# Patient Record
Sex: Female | Born: 1980 | Race: White | Hispanic: No | Smoking: Current every day smoker
Health system: Southern US, Community
[De-identification: ages and names within clinical notes are randomized; demographics above are authoritative.]

## PROBLEM LIST (undated history)

## (undated) DIAGNOSIS — R87629 Unspecified abnormal cytological findings in specimens from vagina: Secondary | ICD-10-CM

## (undated) DIAGNOSIS — R87619 Unspecified abnormal cytological findings in specimens from cervix uteri: Secondary | ICD-10-CM

## (undated) HISTORY — PX: CHOLECYSTECTOMY: SHX55

## (undated) HISTORY — DX: Unspecified abnormal cytological findings in specimens from cervix uteri: R87.619

## (undated) HISTORY — DX: Unspecified abnormal cytological findings in specimens from vagina: R87.629

## (undated) HISTORY — PX: WISDOM TOOTH EXTRACTION: SHX21

## (undated) HISTORY — DX: Morbid (severe) obesity due to excess calories: E66.01

## (undated) HISTORY — PX: TONSILLECTOMY: SUR1361

---

## 2008-05-22 ENCOUNTER — Emergency Department (HOSPITAL_COMMUNITY): Admission: EM | Admit: 2008-05-22 | Discharge: 2008-05-22 | Payer: Self-pay | Admitting: *Deleted

## 2008-11-06 ENCOUNTER — Emergency Department (HOSPITAL_COMMUNITY): Admission: EM | Admit: 2008-11-06 | Discharge: 2008-11-06 | Payer: Self-pay | Admitting: Emergency Medicine

## 2009-10-29 ENCOUNTER — Emergency Department (HOSPITAL_COMMUNITY): Admission: EM | Admit: 2009-10-29 | Discharge: 2009-10-29 | Payer: Self-pay | Admitting: Emergency Medicine

## 2010-03-22 LAB — URINALYSIS, ROUTINE W REFLEX MICROSCOPIC
Glucose, UA: NEGATIVE mg/dL
Ketones, ur: NEGATIVE mg/dL
Protein, ur: NEGATIVE mg/dL
Urobilinogen, UA: 0.2 mg/dL (ref 0.0–1.0)

## 2010-03-22 LAB — URINE MICROSCOPIC-ADD ON

## 2010-04-13 LAB — URINALYSIS, ROUTINE W REFLEX MICROSCOPIC
Nitrite: NEGATIVE
Protein, ur: NEGATIVE mg/dL
Specific Gravity, Urine: 1.015 (ref 1.005–1.030)
Urobilinogen, UA: 1 mg/dL (ref 0.0–1.0)

## 2010-04-13 LAB — URINE MICROSCOPIC-ADD ON

## 2010-04-13 LAB — URINE CULTURE

## 2010-12-08 ENCOUNTER — Emergency Department (HOSPITAL_COMMUNITY)
Admission: EM | Admit: 2010-12-08 | Discharge: 2010-12-08 | Disposition: A | Discharge status: Home / Self Care | Payer: 59 | Attending: Emergency Medicine | Admitting: Emergency Medicine

## 2010-12-08 ENCOUNTER — Encounter: Payer: Self-pay | Admitting: *Deleted

## 2010-12-08 DIAGNOSIS — R112 Nausea with vomiting, unspecified: Secondary | ICD-10-CM | POA: Insufficient documentation

## 2010-12-08 DIAGNOSIS — R109 Unspecified abdominal pain: Secondary | ICD-10-CM | POA: Insufficient documentation

## 2010-12-08 LAB — POCT I-STAT, CHEM 8
BUN: 15 mg/dL (ref 6–23)
Hemoglobin: 15 g/dL (ref 12.0–15.0)
Potassium: 3.5 mEq/L (ref 3.5–5.1)
Sodium: 143 mEq/L (ref 135–145)
TCO2: 24 mmol/L (ref 0–100)

## 2010-12-08 LAB — CBC
MCH: 32.5 pg (ref 26.0–34.0)
Platelets: 257 10*3/uL (ref 150–400)
RBC: 4.4 MIL/uL (ref 3.87–5.11)
WBC: 12.7 10*3/uL — ABNORMAL HIGH (ref 4.0–10.5)

## 2010-12-08 LAB — DIFFERENTIAL
Eosinophils Absolute: 0.4 10*3/uL (ref 0.0–0.7)
Lymphs Abs: 4.3 10*3/uL — ABNORMAL HIGH (ref 0.7–4.0)
Neutro Abs: 6.9 10*3/uL (ref 1.7–7.7)
Neutrophils Relative %: 54 % (ref 43–77)

## 2010-12-08 LAB — URINALYSIS, DIPSTICK ONLY
Bilirubin Urine: NEGATIVE
Nitrite: NEGATIVE
Specific Gravity, Urine: 1.026 (ref 1.005–1.030)
pH: 6.5 (ref 5.0–8.0)

## 2010-12-08 LAB — POCT PREGNANCY, URINE: Preg Test, Ur: NEGATIVE

## 2010-12-08 MED ORDER — TRAMADOL HCL 50 MG PO TABS: 50.0000 mg | ORAL_TABLET | Freq: Four times a day (QID) | ORAL | Status: AC | PRN

## 2010-12-08 MED ORDER — ONDANSETRON HCL 4 MG PO TABS: 4.0000 mg | ORAL_TABLET | Freq: Four times a day (QID) | ORAL | Status: AC

## 2010-12-08 MED ORDER — SODIUM CHLORIDE 0.9 % IV BOLUS (SEPSIS)
1000.0000 mL | Freq: Once | INTRAVENOUS | Status: AC
  Administered 2010-12-08: 1000 mL via INTRAVENOUS

## 2010-12-08 MED ORDER — ONDANSETRON HCL 4 MG/2ML IJ SOLN
4.0000 mg | Freq: Once | INTRAMUSCULAR | Status: AC
  Administered 2010-12-08: 4 mg via INTRAVENOUS
  Filled 2010-12-08: qty 2

## 2010-12-08 NOTE — ED Notes (Signed)
PA in to see pt. 

## 2010-12-08 NOTE — ED Notes (Signed)
Pt states that she has had stomach cramps for the past week.  Around 0130 this am, the cramps became very painful and she states "I feel like I'm gonna have a baby".  Pt's abdomin is soft and tender to palpation only mid abdominally to her epigastric region. Pt has attempted to vomit but is unable.  Pt denies diarrhea.

## 2010-12-08 NOTE — ED Provider Notes (Signed)
History     CSN: [id number] Arrival date & time: 12/08/2010  4:50 AM   First MD Initiated Contact with Patient 12/08/10 [phone]      Chief Complaint  Patient presents with  . Abdominal Pain    (Consider location/radiation/quality/duration/timing/severity/associated sxs/prior treatment) Patient is a 30 y.o. female presenting with abdominal pain. The history is provided by the patient.  Abdominal Pain The primary symptoms of the illness include abdominal pain, nausea and vomiting (1 episode). The primary symptoms of the illness do not include fever, fatigue, shortness of breath, diarrhea, hematemesis, hematochezia, dysuria, vaginal discharge or vaginal bleeding. The current episode started more than 2 days ago. The onset of the illness was gradual. The problem has been gradually improving.  The pain came on suddenly. The abdominal pain is located in the LUQ and epigastric region. The abdominal pain does not radiate. The severity of the abdominal pain is 5/10. The abdominal pain is relieved by nothing.  The patient states that she believes she is currently not pregnant. The patient has not had a change in bowel habit.   Pt states that she has not been feeling well for the past week, then last night when she woke up she was having very sharp LUQ pain and sensations of cramping to where she thought she was going to "have a baby" The pain was at a 10 but upon my examination the pain is now a 2/10. She has had one episode of vomiting, no diarrhea, no fevers.  History reviewed. No pertinent past medical history.  Past Surgical History  Procedure Date  . Tonsillectomy     History reviewed. No pertinent family history.  History  Substance Use Topics  . Smoking status: Not on file  . Smokeless tobacco: Not on file  . Alcohol Use:     OB History    Grav Para Term Preterm Abortions TAB SAB Ect Mult Living   4 4 4  0 0 0 0 0 0 4      Review of Systems  Constitutional: Negative for fever  and fatigue.  Respiratory: Negative for shortness of breath.   Gastrointestinal: Positive for nausea, vomiting (1 episode) and abdominal pain. Negative for diarrhea, hematochezia and hematemesis.  Genitourinary: Negative for dysuria, vaginal bleeding and vaginal discharge.  All other systems reviewed and are negative.    Allergies  Review of patient's allergies indicates no known allergies.  Home Medications   Current Outpatient Rx  Name Route Sig Dispense Refill  . ASPIRIN-ACETAMINOPHEN-CAFFEINE 250-250-65 MG PO TABS Oral Take 1 tablet by mouth every 6 (six) hours as needed. Headaches       BP 137/82  Pulse 80  Temp(Src) 98 F (36.7 C) (Oral)  Resp 19  SpO2 100%  LMP 11/24/2010  Physical Exam  Nursing note and vitals reviewed. Constitutional: She appears well-developed and well-nourished.  HENT:  Head: Normocephalic and atraumatic.  Eyes: Conjunctivae are normal. Pupils are equal, round, and reactive to light.  Neck: Trachea normal, normal range of motion and full passive range of motion without pain. Neck supple.  Cardiovascular: Normal rate, regular rhythm and normal pulses.   Pulmonary/Chest: Effort normal and breath sounds normal. Chest wall is not dull to percussion. She exhibits no tenderness, no crepitus, no edema, no deformity and no retraction.  Abdominal: Soft. Normal appearance and bowel sounds are normal. She exhibits no distension and no mass. There is no tenderness. There is no rebound and no guarding.  Musculoskeletal: Normal range of motion.  Neurological:  She is alert. She has normal strength.  Skin: Skin is warm, dry and intact.  Psychiatric: She has a normal mood and affect. Her speech is normal and behavior is normal. Judgment and thought content normal. Cognition and memory are normal.    ED Course  Procedures (including critical care time)  Labs Reviewed  CBC - Abnormal; Notable for the following:    WBC 12.7 (*)    All other components within  normal limits  DIFFERENTIAL - Abnormal; Notable for the following:    Lymphs Abs 4.3 (*)    Monocytes Absolute 1.1 (*)    All other components within normal limits  POCT I-STAT, CHEM 8 - Abnormal; Notable for the following:    Glucose, Bld 112 (*)    All other components within normal limits  URINALYSIS, DIPSTICK ONLY - Abnormal; Notable for the following:    Leukocytes, UA SMALL (*)    All other components within normal limits  POCT PREGNANCY, URINE  PREGNANCY, URINE  I-STAT, CHEM 8  POCT PREGNANCY, URINE  LIPASE, BLOOD   No results found.   No diagnosis found.    MDM  Pt feeling better with fluids and antinausea medication. Still having some mild LUQ cramping but its down to a 1-2/10.        Dorthula Matas, PA 12/08/10 [phone]

## 2010-12-08 NOTE — ED Provider Notes (Signed)
Medical screening examination/treatment/procedure(s) were performed by non-physician practitioner and as supervising physician I was immediately available for consultation/collaboration.  Olivia Mackie, MD 12/08/10 [phone]

## 2011-01-31 ENCOUNTER — Emergency Department (HOSPITAL_COMMUNITY)
Admission: EM | Admit: 2011-01-31 | Discharge: 2011-01-31 | Disposition: A | Discharge status: Home / Self Care | Payer: 59 | Attending: Emergency Medicine | Admitting: Emergency Medicine

## 2011-01-31 ENCOUNTER — Emergency Department (HOSPITAL_COMMUNITY): Payer: 59

## 2011-01-31 ENCOUNTER — Encounter (HOSPITAL_COMMUNITY): Payer: Self-pay | Admitting: General Practice

## 2011-01-31 DIAGNOSIS — K2970 Gastritis, unspecified, without bleeding: Secondary | ICD-10-CM | POA: Insufficient documentation

## 2011-01-31 DIAGNOSIS — K2990 Gastroduodenitis, unspecified, without bleeding: Secondary | ICD-10-CM | POA: Insufficient documentation

## 2011-01-31 DIAGNOSIS — R109 Unspecified abdominal pain: Secondary | ICD-10-CM | POA: Insufficient documentation

## 2011-01-31 DIAGNOSIS — R10819 Abdominal tenderness, unspecified site: Secondary | ICD-10-CM | POA: Insufficient documentation

## 2011-01-31 LAB — URINALYSIS, ROUTINE W REFLEX MICROSCOPIC
Bilirubin Urine: NEGATIVE
Hgb urine dipstick: NEGATIVE
Ketones, ur: NEGATIVE mg/dL
Nitrite: NEGATIVE
Protein, ur: NEGATIVE mg/dL
Specific Gravity, Urine: 1.028 (ref 1.005–1.030)
Urobilinogen, UA: 0.2 mg/dL (ref 0.0–1.0)

## 2011-01-31 LAB — DIFFERENTIAL
Lymphocytes Relative: 25 % (ref 12–46)
Lymphs Abs: 2.3 10*3/uL (ref 0.7–4.0)
Neutrophils Relative %: 63 % (ref 43–77)

## 2011-01-31 LAB — COMPREHENSIVE METABOLIC PANEL
ALT: 30 U/L (ref 0–35)
Alkaline Phosphatase: 117 U/L (ref 39–117)
BUN: 12 mg/dL (ref 6–23)
CO2: 25 mEq/L (ref 19–32)
GFR calc Af Amer: >90 mL/min (ref >90)
GFR calc non Af Amer: >90 mL/min (ref >90)
Glucose, Bld: 78 mg/dL (ref 70–99)
Potassium: 3.5 mEq/L (ref 3.5–5.1)
Total Protein: 6.9 g/dL (ref 6.0–8.3)

## 2011-01-31 LAB — CBC
Platelets: 255 10*3/uL (ref 150–400)
RBC: 4.4 MIL/uL (ref 3.87–5.11)
WBC: 9.1 10*3/uL (ref 4.0–10.5)

## 2011-01-31 LAB — LIPASE, BLOOD: Lipase: 26 U/L (ref 11–59)

## 2011-01-31 MED ORDER — LANSOPRAZOLE 30 MG PO CPDR: 30.0000 mg | DELAYED_RELEASE_CAPSULE | Freq: Every day | ORAL | Status: DC

## 2011-01-31 MED ORDER — GI COCKTAIL ~~LOC~~
30.0000 mL | Freq: Once | ORAL | Status: AC
  Administered 2011-01-31: 30 mL via ORAL
  Filled 2011-01-31: qty 30

## 2011-01-31 NOTE — ED Notes (Signed)
Pt remains in U/s

## 2011-01-31 NOTE — ED Notes (Signed)
Patient c/o intermittent abdominal pain for about three months. Patient reports increased abdominal pain over the past three days with nausea and vomiting.

## 2011-01-31 NOTE — ED Notes (Signed)
Patient transported to Ultrasound 

## 2011-01-31 NOTE — ED Provider Notes (Signed)
History     CSN: [id number]  Arrival date & time 01/31/11  1030   First MD Initiated Contact with Patient 01/31/11 1137      Chief Complaint  Patient presents with  . Abdominal Pain  . Nausea  . Emesis    (Consider location/radiation/quality/duration/timing/severity/associated sxs/prior treatment) HPI Comments: PT with 3 month hx of intermittent pains to upper abd.  Worse last 3 days.  Not related to eating.  +n/v last couple of days.  No fevers, no UTI symptoms.    Patient is a 31 y.o. female presenting with abdominal pain. The history is provided by the patient.  Abdominal Pain The primary symptoms of the illness include abdominal pain, nausea and vomiting. The primary symptoms of the illness do not include fever, fatigue, shortness of breath or diarrhea. The onset of the illness was gradual. The problem has been gradually worsening.  Symptoms associated with the illness do not include chills, anorexia, diaphoresis, heartburn, constipation, urgency, hematuria, frequency or back pain. Significant associated medical issues do not include PUD, inflammatory bowel disease, diabetes or gallstones.    History reviewed. No pertinent past medical history.  Past Surgical History  Procedure Date  . Tonsillectomy     No family history on file.  History  Substance Use Topics  . Smoking status: Current Everyday Smoker -- 0.5 packs/day for 5 years  . Smokeless tobacco: Not on file  . Alcohol Use: No    OB History    Grav Para Term Preterm Abortions TAB SAB Ect Mult Living   4 4 4  0 0 0 0 0 0 4      Review of Systems  Constitutional: Negative for fever, chills, diaphoresis and fatigue.  HENT: Negative for congestion, rhinorrhea and sneezing.   Eyes: Negative.   Respiratory: Negative for cough, chest tightness and shortness of breath.   Cardiovascular: Negative for chest pain and leg swelling.  Gastrointestinal: Positive for nausea, vomiting and abdominal pain. Negative for  heartburn, diarrhea, constipation, blood in stool and anorexia.  Genitourinary: Negative for urgency, frequency, hematuria, flank pain and difficulty urinating.  Musculoskeletal: Negative for back pain and arthralgias.  Skin: Negative for rash.  Neurological: Negative for dizziness, speech difficulty, weakness, numbness and headaches.    Allergies  Review of patient's allergies indicates no known allergies.  Home Medications   Current Outpatient Rx  Name Route Sig Dispense Refill  . ETONOGESTREL-ETHINYL ESTRADIOL 0.12-0.015 MG/24HR VA RING Vaginal Place 1 each vaginally every 28 (twenty-eight) days. Insert vaginally and leave in place for 3 consecutive weeks, then remove for 1 week.    Marland Kitchen LANSOPRAZOLE 30 MG PO CPDR Oral Take 30 mg by mouth daily.    Marland Kitchen ONDANSETRON HCL 4 MG PO TABS Oral Take 4 mg by mouth every 6 (six) hours as needed. nausea    . TRAMADOL HCL 50 MG PO TABS Oral Take 50 mg by mouth every 6 (six) hours as needed. pain    . LANSOPRAZOLE 30 MG PO CPDR Oral Take 1 capsule (30 mg total) by mouth daily. 30 capsule 0    BP 157/96  Pulse 80  Temp(Src) 98 F (36.7 C) (Oral)  Resp 18  SpO2 100%  LMP 12/31/2010  Physical Exam  Constitutional: She is oriented to person, place, and time. She appears well-developed and well-nourished.  HENT:  Head: Normocephalic and atraumatic.  Eyes: Pupils are equal, round, and reactive to light.  Neck: Normal range of motion. Neck supple.  Cardiovascular: Normal rate, regular rhythm and  normal heart sounds.   Pulmonary/Chest: Effort normal and breath sounds normal. No respiratory distress. She has no wheezes. She has no rales. She exhibits no tenderness.  Abdominal: Soft. Bowel sounds are normal. There is tenderness. There is no rebound and no guarding.       Mild tenderness to upper abd  Musculoskeletal: Normal range of motion. She exhibits no edema.  Lymphadenopathy:    She has no cervical adenopathy.  Neurological: She is alert and  oriented to person, place, and time.  Skin: Skin is warm and dry. No rash noted.  Psychiatric: She has a normal mood and affect.    ED Course  Procedures (including critical care time)  Results for orders placed during the hospital encounter of 01/31/11  CBC      Component Value Range   WBC 9.1  4.0 - 10.5 (K/uL)   RBC 4.40  3.87 - 5.11 (MIL/uL)   Hemoglobin 14.2  12.0 - 15.0 (g/dL)   HCT 16.1  09.6 - 04.5 (%)   MCV 92.7  78.0 - 100.0 (fL)   MCH 32.3  26.0 - 34.0 (pg)   MCHC 34.8  30.0 - 36.0 (g/dL)   RDW 40.9  81.1 - 91.4 (%)   Platelets 255  150 - 400 (K/uL)  DIFFERENTIAL      Component Value Range   Neutrophils Relative 63  43 - 77 (%)   Neutro Abs 5.7  1.7 - 7.7 (K/uL)   Lymphocytes Relative 25  12 - 46 (%)   Lymphs Abs 2.3  0.7 - 4.0 (K/uL)   Monocytes Relative 8  3 - 12 (%)   Monocytes Absolute 0.7  0.1 - 1.0 (K/uL)   Eosinophils Relative 3  0 - 5 (%)   Eosinophils Absolute 0.3  0.0 - 0.7 (K/uL)   Basophils Relative 1  0 - 1 (%)   Basophils Absolute 0.1  0.0 - 0.1 (K/uL)  COMPREHENSIVE METABOLIC PANEL      Component Value Range   Sodium 140  135 - 145 (mEq/L)   Potassium 3.5  3.5 - 5.1 (mEq/L)   Chloride 106  96 - 112 (mEq/L)   CO2 25  19 - 32 (mEq/L)   Glucose, Bld 78  70 - 99 (mg/dL)   BUN 12  6 - 23 (mg/dL)   Creatinine, Ser 7.82  0.50 - 1.10 (mg/dL)   Calcium 9.0  8.4 - 95.6 (mg/dL)   Total Protein 6.9  6.0 - 8.3 (g/dL)   Albumin 3.8  3.5 - 5.2 (g/dL)   AST 19  0 - 37 (U/L)   ALT 30  0 - 35 (U/L)   Alkaline Phosphatase 117  39 - 117 (U/L)   Total Bilirubin 0.1 (*) 0.3 - 1.2 (mg/dL)   GFR calc non Af Amer >90  >90 (mL/min)   GFR calc Af Amer >90  >90 (mL/min)  LIPASE, BLOOD      Component Value Range   Lipase 26  11 - 59 (U/L)  URINALYSIS, ROUTINE W REFLEX MICROSCOPIC      Component Value Range   Color, Urine YELLOW  YELLOW    APPearance CLEAR  CLEAR    Specific Gravity, Urine 1.028  1.005 - 1.030    pH 5.5  5.0 - 8.0    Glucose, UA NEGATIVE   NEGATIVE (mg/dL)   Hgb urine dipstick NEGATIVE  NEGATIVE    Bilirubin Urine NEGATIVE  NEGATIVE    Ketones, ur NEGATIVE  NEGATIVE (mg/dL)   Protein,  ur NEGATIVE  NEGATIVE (mg/dL)   Urobilinogen, UA 0.2  0.0 - 1.0 (mg/dL)   Nitrite NEGATIVE  NEGATIVE    Leukocytes, UA NEGATIVE  NEGATIVE   PREGNANCY, URINE      Component Value Range   Preg Test, Ur NEGATIVE     US Abdomen Complete  01/31/2011  *RADIOLOGY REPORT*  Clinical Data:  Abdominal pain.  COMPLETE ABDOMINAL ULTRASOUND  Comparison:  None.  Findings:  Gallbladder:  No gallstones, gallbladder wall thickening, or pericholecystic fluid.  Common bile duct:   Within normal limits in caliber.  Liver:  No focal lesion identified.  Within normal limits in parenchymal echogenicity.  IVC:  Appears normal.  Pancreas:  No focal abnormality seen.  Spleen:  Within normal limits in size and echotexture.  Right Kidney:   Normal in size and parenchymal echogenicity.  No evidence of mass or hydronephrosis.  Left Kidney:  Normal in size and parenchymal echogenicity.  No evidence of mass or hydronephrosis.  Abdominal aorta:  No aneurysm identified.  IMPRESSION: Negative abdominal ultrasound.  Original Report Authenticated By: Cyndie Chime, M.D.      1. Gastritis       MDM  No evidence of gallstones.  Better after GI cocktail.  No evidence of pancreatitis. Likely gastritis        Rolan Bucco, MD 01/31/11 [phone]

## 2011-02-07 ENCOUNTER — Other Ambulatory Visit: Payer: Self-pay | Admitting: Gastroenterology

## 2011-02-07 ENCOUNTER — Ambulatory Visit
Admission: RE | Admit: 2011-02-07 | Discharge: 2011-02-07 | Disposition: A | Discharge status: Home / Self Care | Payer: 59 | Source: Ambulatory Visit | Attending: Gastroenterology | Admitting: Gastroenterology

## 2011-02-13 ENCOUNTER — Encounter (HOSPITAL_COMMUNITY): Payer: Self-pay | Admitting: Emergency Medicine

## 2011-02-13 ENCOUNTER — Emergency Department (HOSPITAL_COMMUNITY)
Admission: EM | Admit: 2011-02-13 | Discharge: 2011-02-14 | Disposition: A | Discharge status: Home / Self Care | Payer: 59 | Attending: Emergency Medicine | Admitting: Emergency Medicine

## 2011-02-13 DIAGNOSIS — F17200 Nicotine dependence, unspecified, uncomplicated: Secondary | ICD-10-CM | POA: Insufficient documentation

## 2011-02-13 DIAGNOSIS — R112 Nausea with vomiting, unspecified: Secondary | ICD-10-CM | POA: Insufficient documentation

## 2011-02-13 DIAGNOSIS — K5900 Constipation, unspecified: Secondary | ICD-10-CM | POA: Insufficient documentation

## 2011-02-13 DIAGNOSIS — R1084 Generalized abdominal pain: Secondary | ICD-10-CM | POA: Insufficient documentation

## 2011-02-13 NOTE — ED Notes (Signed)
Pt states she has been here for abd pain and was sent to the GI dr and was told she had a large amt of stool and she did a bowel prep over the weekend and has been taking miralax and then today she used an enema and has not had any results  Pt states she passed liquid with the prep but never actually passed any formed stool  Pt states she does not know when her last BM was  Pt is c/o abd pain tonight

## 2011-02-14 ENCOUNTER — Emergency Department (HOSPITAL_COMMUNITY): Payer: 59

## 2011-02-14 NOTE — ED Provider Notes (Signed)
Medical screening examination/treatment/procedure(s) were performed by non-physician practitioner and as supervising physician I was immediately available for consultation/collaboration.  Ary Lavine R. Canisha Issac, MD 02/14/11 0844 

## 2011-02-14 NOTE — ED Provider Notes (Signed)
History     CSN: [id number]  Arrival date & time 02/13/11  2155   First MD Initiated Contact with Patient 02/13/11 2352      Chief Complaint  Patient presents with  . Abdominal Pain    (Consider location/radiation/quality/duration/timing/severity/associated sxs/prior treatment) Patient is a 31 y.o. female presenting with abdominal pain. The history is provided by the patient.  Abdominal Pain The primary symptoms of the illness include abdominal pain, nausea and vomiting. The primary symptoms of the illness do not include fever, diarrhea or dysuria. The current episode started more than 2 days ago. The onset of the illness was gradual. The problem has not changed since onset. Reports persistent constipation. Has been seen here in the emergency department November 3rd, January 23rd and again January 30. Followed up with Dr. Dulce Sellar per hour referral and was placed on a colonic prep. Patient has also been on daily MiraLAX and has tried at home enemas several times. Patient states she finished the prep yesterday and only had return of the liquid prep no stool. States that she has not had a bowel movement now in approximately 1/2-2 weeks. Complains of generalized abdominal pain with occasional bouts of nausea and vomiting sometimes after eating. Denies fever, UTI symptoms or other associated symptoms.  History reviewed. No pertinent past medical history.  Past Surgical History  Procedure Date  . Tonsillectomy     Family History  Problem Relation Age of Onset  . Diabetes Other     History  Substance Use Topics  . Smoking status: Current Everyday Smoker -- 0.5 packs/day for 5 years  . Smokeless tobacco: Not on file  . Alcohol Use: No    OB History    Grav Para Term Preterm Abortions TAB SAB Ect Mult Living   4 4 4  0 0 0 0 0 0 4      Review of Systems  Constitutional: Negative.  Negative for fever.  HENT: Negative.   Eyes: Negative.   Respiratory: Negative.   Cardiovascular:  Negative.   Gastrointestinal: Positive for nausea, vomiting and abdominal pain. Negative for diarrhea.  Genitourinary: Negative.  Negative for dysuria.  Musculoskeletal: Negative.   Skin: Negative.   Neurological: Negative.   Hematological: Negative.   Psychiatric/Behavioral: Negative.     Allergies  Review of patient's allergies indicates no known allergies.  Home Medications   Current Outpatient Rx  Name Route Sig Dispense Refill  . ETONOGESTREL-ETHINYL ESTRADIOL 0.12-0.015 MG/24HR VA RING Vaginal Place 1 each vaginally every 28 (twenty-eight) days. Insert vaginally and leave in place for 3 consecutive weeks, then remove for 1 week.    Marland Kitchen POLYETHYLENE GLYCOL 3350 PO PACK Oral Take 17 g by mouth daily.      BP 105/57  Pulse 85  Temp(Src) 98.7 F (37.1 C) (Oral)  Resp 20  SpO2 98%  LMP 01/07/2011  Physical Exam  Constitutional: She is oriented to person, place, and time. She appears well-developed and well-nourished.  HENT:  Head: Normocephalic and atraumatic.  Eyes: Conjunctivae are normal.  Neck: Neck supple.  Cardiovascular: Normal rate and regular rhythm.   Pulmonary/Chest: Effort normal and breath sounds normal.  Abdominal: Soft. She exhibits no distension and no mass. Bowel sounds are increased.       Patient reports generalized abdominal pain upper worse than lower, however no significant TTP upon exam.  Genitourinary: Rectum normal. Rectal exam shows no external hemorrhoid, no internal hemorrhoid, no fissure, no mass and no tenderness.       No  stool in rectal vault  Musculoskeletal: Normal range of motion.  Neurological: She is alert and oriented to person, place, and time.  Skin: Skin is warm and dry. No erythema.  Psychiatric: She has a normal mood and affect.    ED Course  Procedures  Patient has had recent abdominal films, findings consistent with large stool burden,  and abdominal ultrasound without acute findings. Abdominal exam unremarkable to  [facility]  get 2 view abdomen and reevaluate.  Findings and clinical impression discussed with patient. Will plan for discharge home and instruct patient to continue current therapies and to call Dr.Outlaw in the morning for instructions and or follow up. Pt agreeable w/ plan.  Labs Reviewed - No data to display Dg Abd 2 Views  02/14/2011  *RADIOLOGY REPORT*  Clinical Data: Abdominal pain  ABDOMEN - 2 VIEW  Comparison: 02/07/2011  Findings: Lung bases are clear.  Bowel gas pattern is nonobstructive.  Moderate stool burden.  No free intraperitoneal air.  Organ outlines normal where seen.  No acute osseous abnormality.  IMPRESSION: Nonobstructive bowel gas pattern.  Moderate stool burden.  Original Report Authenticated By: Waneta Martins, M.D.     No diagnosis found.    MDM  HPI/PE and clinical findings c/w constipation abd exam unremarkable. Abd film shows mod stool burden. Acute abd process not likely.        Leanne Chang, NP 02/14/11 0249  Roma Kayser Rhyder Bratz, NP 02/14/11 1610

## 2011-02-15 ENCOUNTER — Other Ambulatory Visit: Payer: Self-pay | Admitting: Gastroenterology

## 2011-02-16 ENCOUNTER — Ambulatory Visit
Admission: RE | Admit: 2011-02-16 | Discharge: 2011-02-16 | Disposition: A | Discharge status: Home / Self Care | Payer: 59 | Source: Ambulatory Visit | Attending: Gastroenterology | Admitting: Gastroenterology

## 2011-02-16 MED ORDER — IOHEXOL 300 MG/ML  SOLN
125.0000 mL | Freq: Once | INTRAMUSCULAR | Status: AC | PRN
  Administered 2011-02-16: 125 mL via INTRAVENOUS

## 2011-02-27 ENCOUNTER — Other Ambulatory Visit: Payer: Self-pay | Admitting: Gastroenterology

## 2011-02-27 ENCOUNTER — Ambulatory Visit
Admission: RE | Admit: 2011-02-27 | Discharge: 2011-02-27 | Disposition: A | Discharge status: Home / Self Care | Payer: 59 | Source: Ambulatory Visit | Attending: Gastroenterology | Admitting: Gastroenterology

## 2011-03-02 ENCOUNTER — Ambulatory Visit
Admission: RE | Admit: 2011-03-02 | Discharge: 2011-03-02 | Disposition: A | Discharge status: Home / Self Care | Payer: 59 | Source: Ambulatory Visit | Attending: Gastroenterology | Admitting: Gastroenterology

## 2011-03-02 ENCOUNTER — Other Ambulatory Visit: Payer: Self-pay | Admitting: Gastroenterology

## 2011-03-05 ENCOUNTER — Encounter (HOSPITAL_COMMUNITY): Payer: Self-pay | Admitting: *Deleted

## 2011-03-07 ENCOUNTER — Ambulatory Visit (HOSPITAL_COMMUNITY): Payer: 59 | Admitting: Anesthesiology

## 2011-03-07 ENCOUNTER — Ambulatory Visit (HOSPITAL_COMMUNITY)
Admission: RE | Admit: 2011-03-07 | Discharge: 2011-03-07 | Disposition: A | Discharge status: Home / Self Care | Payer: 59 | Source: Ambulatory Visit | Attending: Gastroenterology | Admitting: Gastroenterology

## 2011-03-07 ENCOUNTER — Encounter (HOSPITAL_COMMUNITY): Payer: Self-pay | Admitting: Anesthesiology

## 2011-03-07 ENCOUNTER — Encounter (HOSPITAL_COMMUNITY): Payer: Self-pay | Admitting: *Deleted

## 2011-03-07 ENCOUNTER — Encounter (HOSPITAL_COMMUNITY)
Admission: RE | Disposition: A | Discharge status: Home / Self Care | Payer: Self-pay | Source: Ambulatory Visit | Attending: Gastroenterology

## 2011-03-07 DIAGNOSIS — F17200 Nicotine dependence, unspecified, uncomplicated: Secondary | ICD-10-CM | POA: Insufficient documentation

## 2011-03-07 DIAGNOSIS — K648 Other hemorrhoids: Secondary | ICD-10-CM | POA: Insufficient documentation

## 2011-03-07 DIAGNOSIS — R1012 Left upper quadrant pain: Secondary | ICD-10-CM | POA: Insufficient documentation

## 2011-03-07 DIAGNOSIS — K5900 Constipation, unspecified: Secondary | ICD-10-CM | POA: Insufficient documentation

## 2011-03-07 HISTORY — PX: ESOPHAGOGASTRODUODENOSCOPY: SHX5428

## 2011-03-07 SURGERY — COLONOSCOPY WITH PROPOFOL
Anesthesia: Monitor Anesthesia Care

## 2011-03-07 MED ORDER — BUTAMBEN-TETRACAINE-BENZOCAINE 2-2-14 % EX AERO
INHALATION_SPRAY | CUTANEOUS | Status: DC | PRN
  Administered 2011-03-07: 2 via TOPICAL

## 2011-03-07 MED ORDER — KETAMINE HCL 10 MG/ML IJ SOLN
INTRAMUSCULAR | Status: DC | PRN
  Administered 2011-03-07 (×3): 20 mg via INTRAVENOUS

## 2011-03-07 MED ORDER — LACTATED RINGERS IV SOLN
INTRAVENOUS | Status: DC
  Administered 2011-03-07: 1000 mL via INTRAVENOUS

## 2011-03-07 MED ORDER — MIDAZOLAM HCL 5 MG/5ML IJ SOLN
INTRAMUSCULAR | Status: DC | PRN
  Administered 2011-03-07 (×2): 2 mg via INTRAVENOUS

## 2011-03-07 MED ORDER — PROPOFOL 10 MG/ML IV EMUL
INTRAVENOUS | Status: DC | PRN
  Administered 2011-03-07: 60 ug/kg/min via INTRAVENOUS

## 2011-03-07 MED ORDER — LACTATED RINGERS IV SOLN
INTRAVENOUS | Status: DC | PRN
  Administered 2011-03-07: 10:00:00 via INTRAVENOUS

## 2011-03-07 MED ORDER — HYOSCYAMINE SULFATE CR 0.375 MG PO CP12: 0.3750 mg | ORAL_CAPSULE | Freq: Two times a day (BID) | ORAL | Status: DC

## 2011-03-07 MED ORDER — FENTANYL CITRATE 0.05 MG/ML IJ SOLN
INTRAMUSCULAR | Status: DC | PRN
  Administered 2011-03-07: 100 ug via INTRAVENOUS

## 2011-03-07 SURGICAL SUPPLY — 21 items

## 2011-03-07 NOTE — H&P (Signed)
Patient interval history reviewed.  Patient examined again.  There has been no change from documented H/P dated 03/05/11 (scanned into chart from our office) except as documented above.  

## 2011-03-07 NOTE — Preoperative (Signed)
Beta Blockers   Reason not to administer Beta Blockers:Not Applicable 

## 2011-03-07 NOTE — Discharge Instructions (Addendum)
Esophagogastroduodenoscopy This is an endoscopic procedure (a procedure that uses a device like a flexible telescope) that allows your caregiver to view the upper stomach and small bowel. This test allows your caregiver to look at the esophagus. The esophagus carries food from your mouth to your stomach. They can also look at your duodenum. This is the first part of the small intestine that attaches to the stomach. This test is used to detect problems in the bowel such as ulcers and inflammation. PREPARATION FOR TEST Nothing to eat after midnight the day before the test. NORMAL FINDINGS Normal esophagus, stomach, and duodenum. Ranges for normal findings may vary among different laboratories and hospitals. You should always check with your doctor after having lab work or other tests done to discuss the meaning of your test results and whether your values are considered within normal limits. MEANING OF TEST  Your caregiver will go over the test results with you and discuss the importance and meaning of your results, as well as treatment options and the need for additional tests if necessary. OBTAINING THE TEST RESULTS It is your responsibility to obtain your test results. Ask the lab or department performing the test when and how you will get your results. Document Released: 04/27/2004 Document Revised: 09/06/2010 Document Reviewed: 12/05/2007 [facility] Patient Information 2012 [place], [place].Colonoscopy A colonoscopy is an exam to evaluate your entire colon. In this exam, your colon is cleansed. A long fiberoptic tube is inserted through your rectum and into your colon. The fiberoptic scope (endoscope) is a long bundle of enclosed and very flexible fibers. These fibers transmit light to the area examined and send images from that area to your caregiver. Discomfort is usually minimal. You may be given a drug to help you sleep (sedative) during or prior to the procedure. This exam helps to detect lumps  (tumors), polyps, inflammation, and areas of bleeding. Your caregiver may also take a small piece of tissue (biopsy) that will be examined under a microscope. LET YOUR CAREGIVER KNOW ABOUT:   Allergies to food or medicine.   Medicines taken, including vitamins, herbs, eyedrops, over-the-counter medicines, and creams.   Use of steroids (by mouth or creams).   Previous problems with anesthetics or numbing medicines.   History of bleeding problems or blood clots.   Previous surgery.   Other health problems, including diabetes and kidney problems.   Possibility of pregnancy, if this applies.  BEFORE THE PROCEDURE   A clear liquid diet may be required for 2 days before the exam.   Ask your caregiver about changing or stopping your regular medications.   Liquid injections (enemas) or laxatives may be required.   A large amount of electrolyte solution may be given to you to drink over a short period of time. This solution is used to clean out your colon.   You should be present 60 minutes prior to your procedure or as directed by your caregiver.  AFTER THE PROCEDURE   If you received a sedative or pain relieving medication, you will need to arrange for someone to drive you home.   Occasionally, there is a little blood passed with the first bowel movement. Do not be concerned.  FINDING OUT THE RESULTS OF YOUR TEST Not all test results are available during your visit. If your test results are not back during the visit, make an appointment with your caregiver to find out the results. Do not assume everything is normal if you have not heard from your caregiver or  the medical facility. It is important for you to follow up on all of your test results. HOME CARE INSTRUCTIONS   It is not unusual to pass moderate amounts of gas and experience mild abdominal cramping following the procedure. This is due to air being used to inflate your colon during the exam. Walking or a warm pack on your belly  (abdomen) may help.   You may resume all normal meals and activities after sedatives and medicines have worn off.   Only take over-the-counter or prescription medicines for pain, discomfort, or fever as directed by your caregiver. Do not use aspirin or blood thinners if a biopsy was taken. Consult your caregiver for medicine usage if biopsies were taken.  SEEK IMMEDIATE MEDICAL CARE IF:   You have a fever.   You pass large blood clots or fill a toilet with blood following the procedure. This may also occur 10 to 14 days following the procedure. This is more likely if a biopsy was taken.   You develop abdominal pain that keeps getting worse and cannot be relieved with medicine.  Document Released: 12/23/1999 Document Revised: 09/06/2010 Document Reviewed: 08/07/2007 [facility] Patient Information 2012 [place], [place].Esophagogastroduodenoscopy This is an endoscopic procedure (a procedure that uses a device like a flexible telescope) that allows your caregiver to view the upper stomach and small bowel. This test allows your caregiver to look at the esophagus. The esophagus carries food from your mouth to your stomach. They can also look at your duodenum. This is the first part of the small intestine that attaches to the stomach. This test is used to detect problems in the bowel such as ulcers and inflammation. PREPARATION FOR TEST Nothing to eat after midnight the day before the test. NORMAL FINDINGS Normal esophagus, stomach, and duodenum. Ranges for normal findings may vary among different laboratories and hospitals. You should always check with your doctor after having lab work or other tests done to discuss the meaning of your test results and whether your values are considered within normal limits. MEANING OF TEST  Your caregiver will go over the test results with you and discuss the importance and meaning of your results, as well as treatment options and the need for additional tests if  necessary. OBTAINING THE TEST RESULTS It is your responsibility to obtain your test results. Ask the lab or department performing the test when and how you will get your results. Document Released: 04/27/2004 Document Revised: 09/06/2010 Document Reviewed: 12/05/2007 [facility] Patient Information 2012 [place], [place].  Colonoscopy  Post procedure instructions:  Read the instructions outlined below and refer to this sheet in the next few weeks. These discharge instructions provide you with general information on caring for yourself after you leave the hospital. Your doctor may also give you specific instructions. While your treatment has been planned according to the most current medical practices available, unavoidable complications occasionally occur. If you have any problems or questions after discharge, call Dr. Dulce Sellar at [facility] [phone]).  HOME CARE INSTRUCTIONS  ACTIVITY:  You may resume your regular activity, but move at a slower pace for the next 24 hours.   Take frequent rest periods for the next 24 hours.   Walking will help get rid of the air and reduce the bloated feeling in your belly (abdomen).   No driving for 24 hours (because of the medicine (anesthesia) used during the test).   You may shower.   Do not sign any important legal documents or operate any machinery for  24 hours (because of the anesthesia used during the test).  NUTRITION:  Drink plenty of fluids.   You may resume your normal diet as instructed by your doctor.   Begin with a light meal and progress to your normal diet. Heavy or fried foods are harder to digest and may make you feel sick to your stomach (nauseated).   Avoid alcoholic beverages for 24 hours or as instructed.  MEDICATIONS:  You may resume your normal medications unless your doctor tells you otherwise.  WHAT TO EXPECT TODAY:  Some feelings of bloating in the abdomen.   Passage of more gas than usual.   Spotting of  blood in your stool or on the toilet paper.  IF YOU HAD POLYPS REMOVED DURING THE COLONOSCOPY:  No aspirin products for 7 days or as instructed.   No alcohol for 7 days or as instructed.   Eat a soft diet for the next 24 hours.   FINDING OUT THE RESULTS OF YOUR TEST  Not all test results are available during your visit. If your test results are not back during the visit, make an appointment with your caregiver to find out the results. Do not assume everything is normal if you have not heard from your caregiver or the medical facility. It is important for you to follow up on all of your test results.     SEEK IMMEDIATE MEDICAL CARE IF:   You have more than a spotting of blood in your stool.   Your belly is swollen (abdominal distention).   You are nauseated or vomiting.   You have a fever.   You have abdominal pain or discomfort that is severe or gets worse throughout the day.    Document Released: 08/09/2003 Document Revised: 09/06/2010 Document Reviewed: 08/07/2007 [facility] Patient Information 2012 [place], [place]. Esophagogastroduodenoscopy This is an endoscopic procedure (a procedure that uses a device like a flexible telescope) that allows your caregiver to view the upper stomach and small bowel. This test allows your caregiver to look at the esophagus. The esophagus carries food from your mouth to your stomach. They can also look at your duodenum. This is the first part of the small intestine that attaches to the stomach. This test is used to detect problems in the bowel such as ulcers and inflammation. PREPARATION FOR TEST Nothing to eat after midnight the day before the test. NORMAL FINDINGS Normal esophagus, stomach, and duodenum. Ranges for normal findings may vary among different laboratories and hospitals. You should always check with your doctor after having lab work or other tests done to discuss the meaning of your test results and whether your values are considered  within normal limits. MEANING OF TEST  Your caregiver will go over the test results with you and discuss the importance and meaning of your results, as well as treatment options and the need for additional tests if necessary. OBTAINING THE TEST RESULTS It is your responsibility to obtain your test results. Ask the lab or department performing the test when and how you will get your results. Document Released: 04/27/2004 Document Revised: 09/06/2010 Document Reviewed: 12/05/2007 [facility] Patient Information 2012 [place], [place].Colonoscopy A colonoscopy is an exam to evaluate your entire colon. In this exam, your colon is cleansed. A long fiberoptic tube is inserted through your rectum and into your colon. The fiberoptic scope (endoscope) is a long bundle of enclosed and very flexible fibers. These fibers transmit light to the area examined and send images from that area to your  caregiver. Discomfort is usually minimal. You may be given a drug to help you sleep (sedative) during or prior to the procedure. This exam helps to detect lumps (tumors), polyps, inflammation, and areas of bleeding. Your caregiver may also take a small piece of tissue (biopsy) that will be examined under a microscope. LET YOUR CAREGIVER KNOW ABOUT:   Allergies to food or medicine.   Medicines taken, including vitamins, herbs, eyedrops, over-the-counter medicines, and creams.   Use of steroids (by mouth or creams).   Previous problems with anesthetics or numbing medicines.   History of bleeding problems or blood clots.   Previous surgery.   Other health problems, including diabetes and kidney problems.   Possibility of pregnancy, if this applies.  BEFORE THE PROCEDURE   A clear liquid diet may be required for 2 days before the exam.   Ask your caregiver about changing or stopping your regular medications.   Liquid injections (enemas) or laxatives may be required.   A large amount of electrolyte solution may be  given to you to drink over a short period of time. This solution is used to clean out your colon.   You should be present 60 minutes prior to your procedure or as directed by your caregiver.  AFTER THE PROCEDURE   If you received a sedative or pain relieving medication, you will need to arrange for someone to drive you home.   Occasionally, there is a little blood passed with the first bowel movement. Do not be concerned.  FINDING OUT THE RESULTS OF YOUR TEST Not all test results are available during your visit. If your test results are not back during the visit, make an appointment with your caregiver to find out the results. Do not assume everything is normal if you have not heard from your caregiver or the medical facility. It is important for you to follow up on all of your test results. HOME CARE INSTRUCTIONS   It is not unusual to pass moderate amounts of gas and experience mild abdominal cramping following the procedure. This is due to air being used to inflate your colon during the exam. Walking or a warm pack on your belly (abdomen) may help.   You may resume all normal meals and activities after sedatives and medicines have worn off.   Only take over-the-counter or prescription medicines for pain, discomfort, or fever as directed by your caregiver. Do not use aspirin or blood thinners if a biopsy was taken. Consult your caregiver for medicine usage if biopsies were taken.  SEEK IMMEDIATE MEDICAL CARE IF:   You have a fever.   You pass large blood clots or fill a toilet with blood following the procedure. This may also occur 10 to 14 days following the procedure. This is more likely if a biopsy was taken.   You develop abdominal pain that keeps getting worse and cannot be relieved with medicine.  Document Released: 12/23/1999 Document Revised: 09/06/2010 Document Reviewed: 08/07/2007 [facility] Patient Information 2012 [place], [place].

## 2011-03-07 NOTE — Brief Op Note (Signed)
Please see [facility] notes dated 03/07/11.

## 2011-03-07 NOTE — Transfer of Care (Signed)
Immediate Anesthesia Transfer of Care Note  Patient: Molly [name]  Procedure(s) Performed: Procedure(s) (LRB): COLONOSCOPY WITH PROPOFOL (N/A) ESOPHAGOGASTRODUODENOSCOPY (EGD) (N/A)  Patient Location: PACU  Anesthesia Type: MAC  Level of Consciousness: sedated  Airway & Oxygen Therapy: Patient Spontanous Breathing and Patient connected to nasal cannula oxygen  Post-op Assessment: Report given to PACU RN and Post -op Vital signs reviewed and stable  Post vital signs: Reviewed and stable  Complications: No apparent anesthesia complications

## 2011-03-07 NOTE — Op Note (Signed)
[facility] [address] [place], [place]  [address]  ENDOSCOPY PROCEDURE REPORT  PATIENT:  Molly, [name]  MR#:  [id number] BIRTHDATE:  Sep 15, 1980, 31 yrs. old  GENDER:  female  ENDOSCOPIST:  Willis Modena, MD  PROCEDURE DATE:  03/07/2011 PROCEDURE:  EGD with biopsy, 43239 ASA CLASS:  Class II INDICATIONS:  abdominal pain (left upper quadrant)  MEDICATIONS:   Cetacaine spray x 2, MAC sedation, administered by CRNA  DESCRIPTION OF PROCEDURE:   After the risks benefits and alternatives of the procedure were thoroughly explained, informed consent was obtained.  The  endoscope was introduced through the mouth and advanced to the second portion of the duodenum, without limitations.  The instrument was slowly withdrawn as the mucosa was fully examined.  <<PROCEDUREIMAGES>>  FINDINGS:  Normal esophagus without inflammation, stricture, mass, varices, or Barrett's mucosa.  Normal stomach without ulcer, mass, inflammation.  Normal pylorus.  Normal duodenum to the second portion; random biopsies of normal-appearing duodenum were taken to evaluate for celiac sprue.  ENDOSCOPIC IMPRESSION:    1.  Normal endoscopy; random duodenal biopsies taken; no source of LUQ pain identified.  RECOMMENDATIONS:      1.  Watch for potential complications of procedure. 2.  Await biopsy  results. 3.  Proceed with colonoscopy today.  ______________________________ Willis Modena  CC:  n. eSIGNEDWillis Modena at 03/07/2011 12:03 PM  Cori Razor, [phone]

## 2011-03-07 NOTE — Op Note (Signed)
[facility] [address] [place], [place]  [address]  COLONOSCOPY PROCEDURE REPORT  PATIENT:  Molly [name], Molly [name]  MR#:  [id number] BIRTHDATE:  July 18, 1980, 31 yrs. old  GENDER:  female ENDOSCOPIST:  Willis Modena, MD  PROCEDURE DATE:  03/07/2011 PROCEDURE:  Colonoscopy 62130 ASA CLASS:  Class II INDICATIONS:  change in bowel habits (constipation), abdominal pain MEDICATIONS:   MAC sedation, administered by CRNA  DESCRIPTION OF PROCEDURE:   After the risks benefits and alternatives of the procedure were thoroughly explained, informed consent was obtained.  Digital rectal exam was performed and revealed no abnormalities.   The [id number] ([id number]) endoscope was introduced through the anus and advanced to the terminal ileum which was intubated for a short distance, without limitations. The quality of the prep was fair..  The instrument was then slowly withdrawn as the colon was fully examined.  <<PROCEDUREIMAGES>>  FINDINGS:  Digital rectal exam was normal.  Prep quality was fair, especially in the proximal colon.  Distal 5cm of the terminal ileum was normal.  Ileocecal valve, cecal strap, and appendiceal orifice were normal.  Colonic mucosa throughout was normal with polyps, masses, vascular ectasias, or inflammatory  changes. Moderate internal hemorrhoids, otherwise normal retroflexed view of rectum.  ENDOSCOPIC IMPRESSION:    1.  Moderate internal hemorrhoids. 2.  Otherwise normal colonoscopy  to the terminal ileum; no explanation for the patient's               abdominal pain or change in bowel habits was identified.  RECOMMENDATIONS:      1.  Watch for potential complications of procedure. 2.  Await duodenal biopsies. 3.  Trial of hyoscyamine 0.375 mg po bid. 4.  Follow-up with [facility] in 2-3 weeks.  REPEAT EXAM:  No  ______________________________ Willis Modena  CC:  n. eSIGNEDWillis Modena at 03/07/2011 12:13 PM  Cori Razor,  [phone]

## 2011-03-07 NOTE — Anesthesia Preprocedure Evaluation (Addendum)
Anesthesia Evaluation  Patient identified by MRN, date of birth, ID band Patient awake    Reviewed: Allergy & Precautions, H&P , NPO status , Patient's Chart, lab work & pertinent test results  Airway Mallampati: II TM Distance: >3 FB Neck ROM: Full    Dental No notable dental hx.    Pulmonary Current Smoker,  clear to auscultation  Pulmonary exam normal       Cardiovascular neg cardio ROS Regular Normal    Neuro/Psych Negative Neurological ROS  Negative Psych ROS   GI/Hepatic negative GI ROS, Neg liver ROS,   Endo/Other  Morbid obesity  Renal/GU negative Renal ROS  Genitourinary negative   Musculoskeletal negative musculoskeletal ROS (+)   Abdominal   Peds negative pediatric ROS (+)  Hematology negative hematology ROS (+)   Anesthesia Other Findings   Reproductive/Obstetrics negative OB ROS                           Anesthesia Physical Anesthesia Plan  ASA: III  Anesthesia Plan: MAC   Post-op Pain Management:    Induction: Intravenous  Airway Management Planned: Simple Face Mask  Additional Equipment:   Intra-op Plan:   Post-operative Plan:   Informed Consent: I have reviewed the patients History and Physical, chart, labs and discussed the procedure including the risks, benefits and alternatives for the proposed anesthesia with the patient or authorized representative who has indicated his/her understanding and acceptance.   Dental advisory given  Plan Discussed with: CRNA  Anesthesia Plan Comments:         Anesthesia Quick Evaluation

## 2011-03-07 NOTE — Anesthesia Postprocedure Evaluation (Signed)
  Anesthesia Post-op Note  Patient: Molly [name]  Procedure(s) Performed: Procedure(s) (LRB): COLONOSCOPY WITH PROPOFOL (N/A) ESOPHAGOGASTRODUODENOSCOPY (EGD) (N/A)  Patient Location: PACU  Anesthesia Type: MAC  Level of Consciousness: awake and alert   Airway and Oxygen Therapy: Patient Spontanous Breathing  Post-op Pain: mild  Post-op Assessment: Post-op Vital signs reviewed, Patient's Cardiovascular Status Stable, Respiratory Function Stable, Patent Airway and No signs of Nausea or vomiting  Post-op Vital Signs: stable  Complications: No apparent anesthesia complications

## 2011-03-08 ENCOUNTER — Encounter (HOSPITAL_COMMUNITY): Payer: Self-pay | Admitting: Gastroenterology

## 2012-09-24 ENCOUNTER — Encounter: Payer: Self-pay | Admitting: Advanced Practice Midwife

## 2012-09-24 ENCOUNTER — Ambulatory Visit (INDEPENDENT_AMBULATORY_CARE_PROVIDER_SITE_OTHER): Payer: 59 | Admitting: Advanced Practice Midwife

## 2012-09-24 VITALS — BP 127/84 | HR 87 | Resp 16 | Ht 67.0 in | Wt 269.0 lb

## 2012-09-24 DIAGNOSIS — Z124 Encounter for screening for malignant neoplasm of cervix: Secondary | ICD-10-CM

## 2012-09-24 DIAGNOSIS — Z113 Encounter for screening for infections with a predominantly sexual mode of transmission: Secondary | ICD-10-CM

## 2012-09-24 DIAGNOSIS — E669 Obesity, unspecified: Secondary | ICD-10-CM | POA: Insufficient documentation

## 2012-09-24 DIAGNOSIS — Z3049 Encounter for surveillance of other contraceptives: Secondary | ICD-10-CM

## 2012-09-24 DIAGNOSIS — Z9089 Acquired absence of other organs: Secondary | ICD-10-CM

## 2012-09-24 DIAGNOSIS — Z01419 Encounter for gynecological examination (general) (routine) without abnormal findings: Secondary | ICD-10-CM

## 2012-09-24 DIAGNOSIS — Z9049 Acquired absence of other specified parts of digestive tract: Secondary | ICD-10-CM | POA: Insufficient documentation

## 2012-09-24 DIAGNOSIS — Z9189 Other specified personal risk factors, not elsewhere classified: Secondary | ICD-10-CM

## 2012-09-24 DIAGNOSIS — Z30018 Encounter for initial prescription of other contraceptives: Secondary | ICD-10-CM | POA: Insufficient documentation

## 2012-09-24 DIAGNOSIS — Z202 Contact with and (suspected) exposure to infections with a predominantly sexual mode of transmission: Secondary | ICD-10-CM

## 2012-09-24 MED ORDER — ETONOGESTREL-ETHINYL ESTRADIOL 0.12-0.015 MG/24HR VA RING: VAGINAL_RING | VAGINAL | Status: DC

## 2012-09-24 NOTE — Patient Instructions (Signed)

## 2012-09-24 NOTE — Progress Notes (Signed)
  Subjective:     Molly [name] is a 32 y.o. female here for a routine exam.  Current complaints: None.  Personal health questionnaire reviewed: no.  Had Cholecystectomy done in [place], Feels much better with GI symptoms now.   Gynecologic History Patient's last menstrual period was 08/24/2012. Contraception: NuvaRing vaginal inserts Last Pap: 2013. Results were: normal Last mammogram: never.   Obstetric History OB History  Gravida Para Term Preterm AB SAB TAB Ectopic Multiple Living  4 4 4  0 0 0 0 0 0 4    # Outcome Date GA Lbr Len/2nd Weight Sex Delivery Anes PTL Lv  4 TRM           3 TRM           2 TRM           1 TRM                The following portions of the patient's history were reviewed and updated as appropriate: allergies, current medications, past family history, past medical history, past social history, past surgical history and problem list.  Review of Systems Pertinent items are noted in HPI.    Objective:    BP 127/84  Pulse 87  Resp 16  Ht 5\' 7"  (1.702 m)  Wt 122.018 kg (269 lb)  BMI 42.12 kg/m2  LMP 08/24/2012 General appearance: alert, cooperative, appears stated age and no distress Lungs: clear to auscultation bilaterally Breasts: normal appearance, no masses or tenderness, Inspection negative, No nipple retraction or dimpling, No nipple discharge or bleeding, No axillary or supraclavicular adenopathy Heart: regular rate and rhythm, S1, S2 normal, no murmur, click, rub or gallop Abdomen: soft, non-tender; bowel sounds normal; no masses,  no organomegaly Pelvic: cervix normal in appearance, external genitalia normal, no adnexal masses or tenderness, no cervical motion tenderness, rectovaginal septum normal, uterus normal size, shape, and consistency and vagina normal without discharge Extremities: extremities normal, atraumatic, no cyanosis or edema Skin: Skin color, texture, turgor normal. No rashes or lesions    Assessment:    Healthy  female exam.   Desires STD testing  Plan:   Pap done with additional STD testing  Contraception: NuvaRing vaginal inserts. Follow up in: 1 year.

## 2012-09-25 LAB — RPR

## 2012-09-25 LAB — HIV ANTIBODY (ROUTINE TESTING W REFLEX): HIV: NONREACTIVE

## 2012-10-04 ENCOUNTER — Encounter: Payer: Self-pay | Admitting: Advanced Practice Midwife

## 2012-10-04 DIAGNOSIS — A749 Chlamydial infection, unspecified: Secondary | ICD-10-CM | POA: Insufficient documentation

## 2012-10-04 HISTORY — DX: Chlamydial infection, unspecified: A74.9

## 2012-10-04 MED ORDER — AZITHROMYCIN 500 MG PO TABS: 1000.0000 mg | ORAL_TABLET | Freq: Once | ORAL | Status: AC

## 2012-10-04 NOTE — Addendum Note (Signed)
Addended by: Aviva Signs on: 10/04/2012 01:26 AM   Modules accepted: Orders

## 2012-10-08 ENCOUNTER — Telehealth: Payer: Self-pay | Admitting: *Deleted

## 2012-10-08 DIAGNOSIS — A749 Chlamydial infection, unspecified: Secondary | ICD-10-CM

## 2012-10-08 NOTE — Telephone Encounter (Signed)
Message copied by Gerome Apley on Wed Oct 08, 2012  1:47 PM ------      Message from: Aviva Signs      Created: Sat Oct 04, 2012  1:23 AM      Regarding: normal pap but has Chlamydia       Normal pap with neg HPV            + for Chlamydia, needs Zithromax 1 gm po x 1 ------

## 2012-10-14 MED ORDER — AZITHROMYCIN 250 MG PO TABS: 1000.0000 mg | ORAL_TABLET | Freq: Once | ORAL | Status: DC

## 2012-10-14 NOTE — Telephone Encounter (Signed)
Called patient and left a message to call us back for results. Rx sent to her pharmacy on file and STD card sent in.

## 2012-10-14 NOTE — Telephone Encounter (Signed)
Patient informed. 

## 2012-10-17 ENCOUNTER — Encounter: Payer: Self-pay | Admitting: *Deleted

## 2013-04-09 ENCOUNTER — Encounter: Payer: Self-pay | Admitting: Obstetrics & Gynecology

## 2013-04-09 ENCOUNTER — Ambulatory Visit (INDEPENDENT_AMBULATORY_CARE_PROVIDER_SITE_OTHER): Payer: 59 | Admitting: Obstetrics & Gynecology

## 2013-04-09 VITALS — BP 138/87 | HR 97 | Resp 16 | Ht 67.0 in | Wt 268.0 lb

## 2013-04-09 DIAGNOSIS — Z202 Contact with and (suspected) exposure to infections with a predominantly sexual mode of transmission: Secondary | ICD-10-CM

## 2013-04-09 MED ORDER — AZITHROMYCIN 500 MG PO TABS: 1000.0000 mg | ORAL_TABLET | Freq: Once | ORAL | Status: DC

## 2013-04-09 NOTE — Progress Notes (Signed)
   Subjective:    Patient ID: Molly [name], female    DOB: 10/26/1980, 33 y.o.   MRN: [id number]  HPI  This 33 yo SW lady was discovered to have CT on her pap 9/14. She was treated and thought that her partner was treated also. He recently tested positive again. She would like STI testing and treatment. They have been using condoms since he was treated last week.   Review of Systems     Objective:   Physical Exam        Assessment & Plan:  As above Zithromax 1 gram po once.

## 2013-04-10 LAB — HIV ANTIBODY (ROUTINE TESTING W REFLEX): HIV: NONREACTIVE

## 2013-04-10 LAB — GC/CHLAMYDIA PROBE AMP
CT Probe RNA: POSITIVE — AB
GC PROBE AMP APTIMA: NEGATIVE

## 2013-04-10 LAB — RPR

## 2013-04-10 LAB — HEPATITIS C ANTIBODY: HCV AB: NEGATIVE

## 2013-04-10 LAB — HEPATITIS B SURFACE ANTIGEN: HEP B S AG: NEGATIVE

## 2013-04-13 ENCOUNTER — Telehealth: Payer: Self-pay | Admitting: *Deleted

## 2013-04-13 NOTE — Telephone Encounter (Signed)
Pt notified of positive Chlamydia.  She was given treatment @ the time of her last appt.

## 2013-04-15 ENCOUNTER — Other Ambulatory Visit (INDEPENDENT_AMBULATORY_CARE_PROVIDER_SITE_OTHER): Payer: 59 | Admitting: *Deleted

## 2013-04-15 DIAGNOSIS — Z202 Contact with and (suspected) exposure to infections with a predominantly sexual mode of transmission: Secondary | ICD-10-CM

## 2013-04-17 LAB — GC/CHLAMYDIA PROBE AMP
CT PROBE, AMP APTIMA: NEGATIVE
GC Probe RNA: NEGATIVE

## 2013-04-20 ENCOUNTER — Telehealth: Payer: Self-pay | Admitting: *Deleted

## 2013-04-20 NOTE — Telephone Encounter (Signed)
LM on cell voicemail of neg test results.

## 2013-05-20 IMAGING — CR DG ABDOMEN 1V
2 series · 2 of 2 positions shown · non-contrast
Comparison: CT 02/16/2011

CLINICAL DATA: Abdominal pain with nausea and vomiting.  Sitz
marker taken today.

ABDOMEN - 1 VIEW

[view not recorded (1 of 2)]
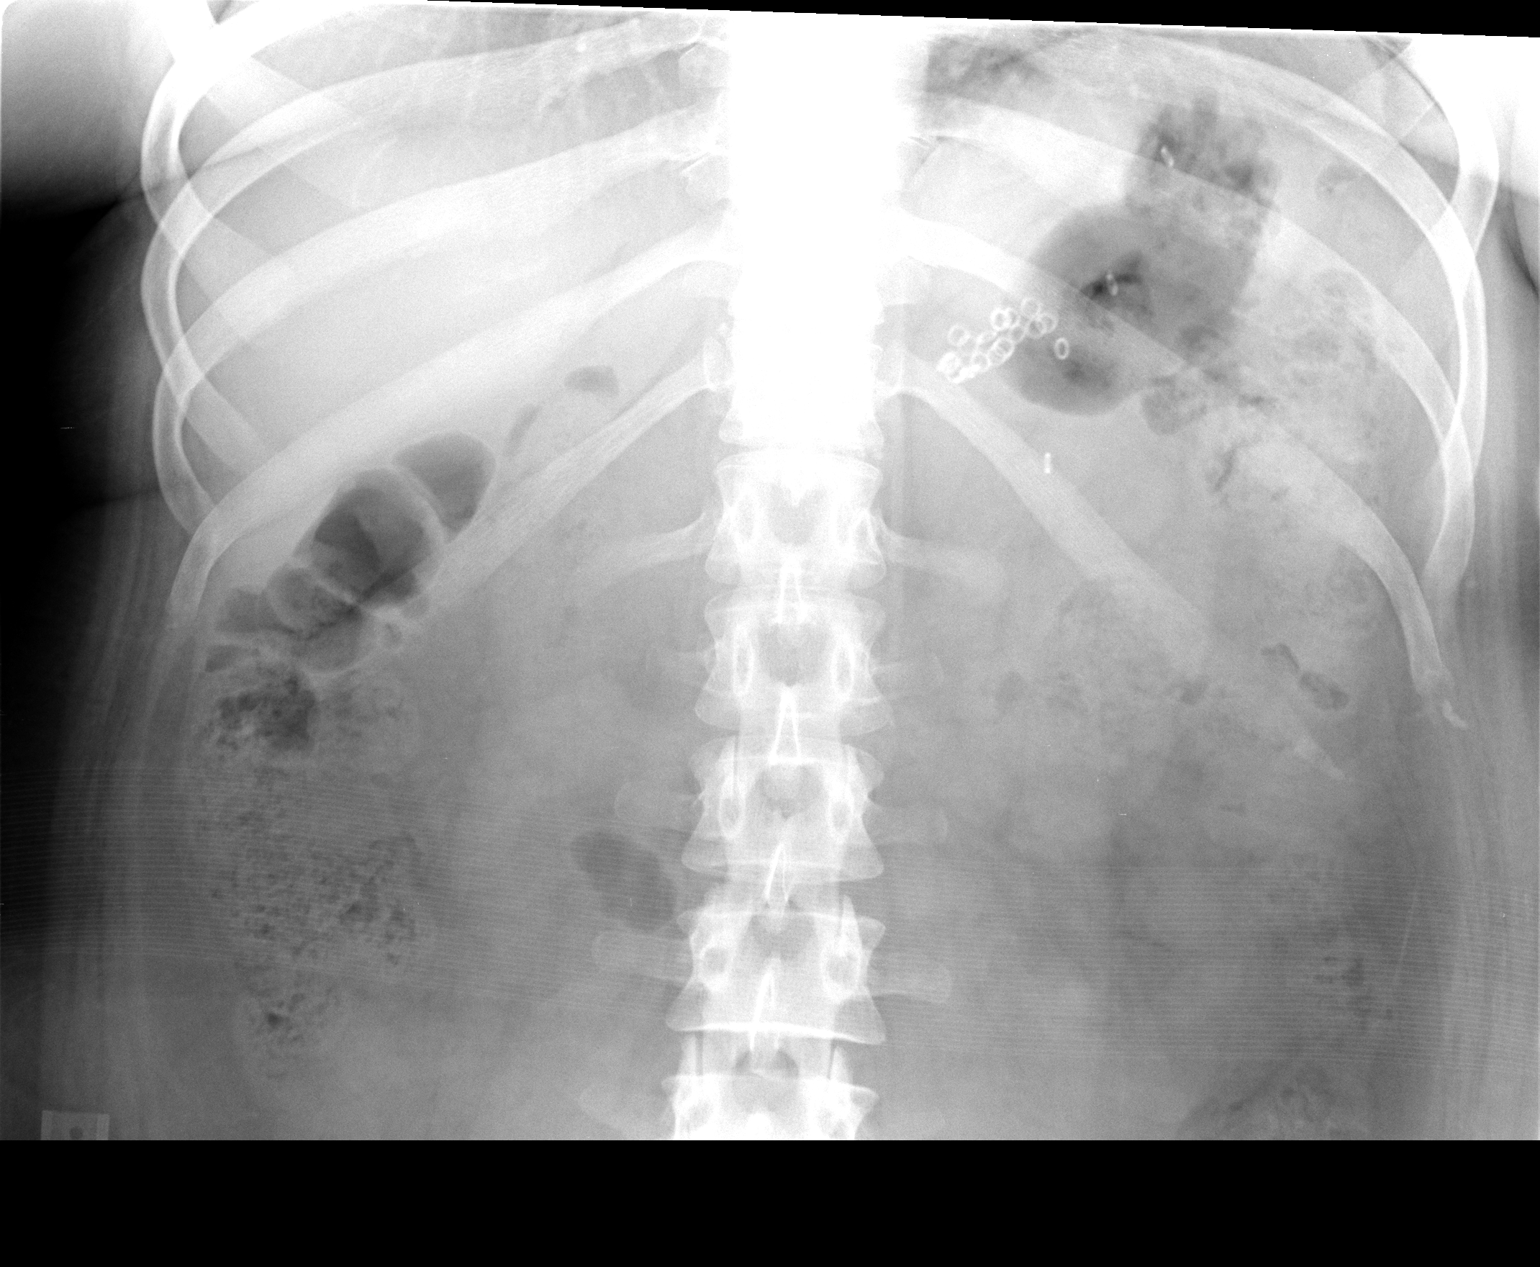

[view not recorded (2 of 2)]
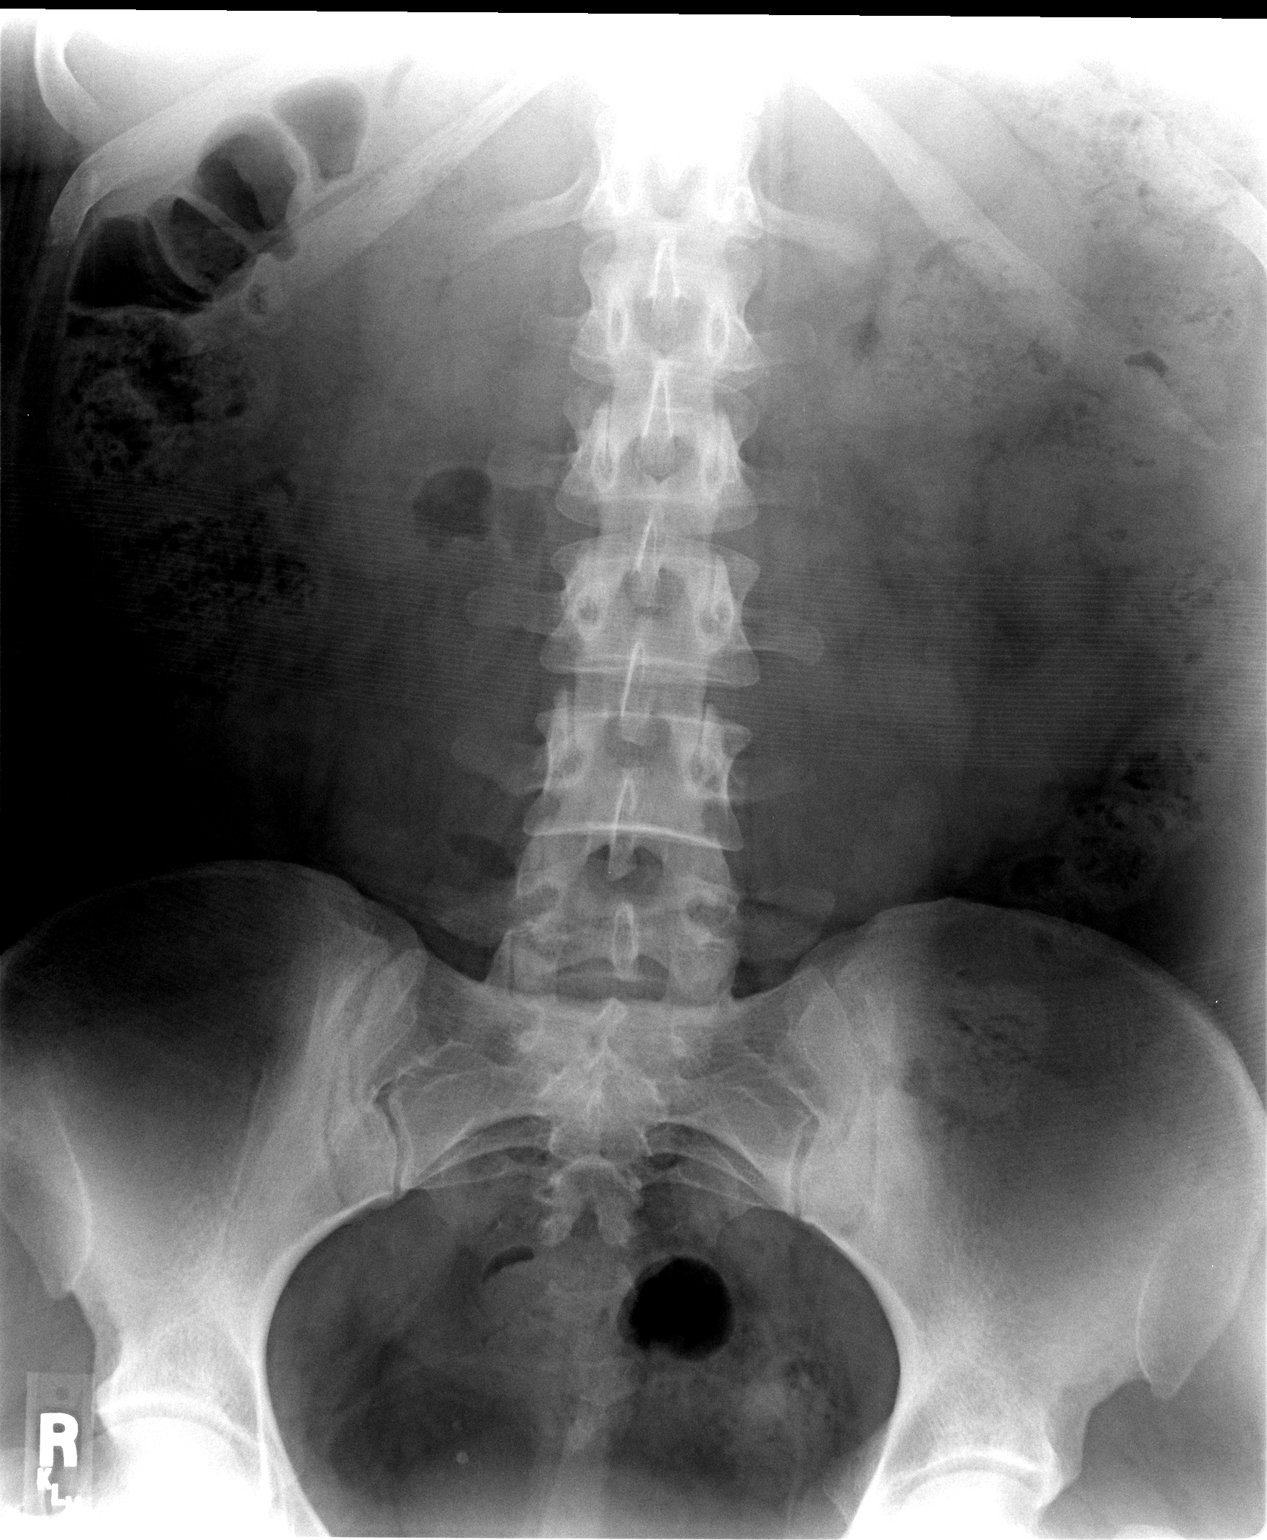

[2 of 2 positions shown; findings below may reference images not displayed]

FINDINGS: Normal bowel gas pattern.  Mild amount of retained stool
throughout the colon.  No dilated bowel loops are seen.

Multiple sitz markers is seen in the stomach.  It is difficult to
count the markers as they are tightly clustered in the stomach.
There are approximately 21 and 22 markers present.
IMPRESSION: Sitz markers in the stomach.

Mild constipation.

## 2013-09-27 ENCOUNTER — Other Ambulatory Visit: Payer: Self-pay | Admitting: Obstetrics & Gynecology

## 2013-09-30 ENCOUNTER — Other Ambulatory Visit: Payer: Self-pay | Admitting: *Deleted

## 2013-09-30 DIAGNOSIS — Z3049 Encounter for surveillance of other contraceptives: Secondary | ICD-10-CM

## 2013-09-30 MED ORDER — ETONOGESTREL-ETHINYL ESTRADIOL 0.12-0.015 MG/24HR VA RING: VAGINAL_RING | VAGINAL | Status: DC

## 2013-09-30 NOTE — Telephone Encounter (Signed)
RF authorization for Nuvaring granted until her appt in Oct.

## 2013-10-12 ENCOUNTER — Ambulatory Visit (INDEPENDENT_AMBULATORY_CARE_PROVIDER_SITE_OTHER): Payer: 59 | Admitting: Obstetrics & Gynecology

## 2013-10-12 ENCOUNTER — Encounter: Payer: Self-pay | Admitting: Obstetrics & Gynecology

## 2013-10-12 VITALS — BP 128/81 | HR 87 | Resp 16 | Ht 67.0 in | Wt 265.0 lb

## 2013-10-12 DIAGNOSIS — Z113 Encounter for screening for infections with a predominantly sexual mode of transmission: Secondary | ICD-10-CM

## 2013-10-12 DIAGNOSIS — E038 Other specified hypothyroidism: Secondary | ICD-10-CM

## 2013-10-12 DIAGNOSIS — Z3049 Encounter for surveillance of other contraceptives: Secondary | ICD-10-CM

## 2013-10-12 DIAGNOSIS — Z124 Encounter for screening for malignant neoplasm of cervix: Secondary | ICD-10-CM

## 2013-10-12 DIAGNOSIS — Z1151 Encounter for screening for human papillomavirus (HPV): Secondary | ICD-10-CM

## 2013-10-12 DIAGNOSIS — Z118 Encounter for screening for other infectious and parasitic diseases: Secondary | ICD-10-CM

## 2013-10-12 DIAGNOSIS — Z0000 Encounter for general adult medical examination without abnormal findings: Secondary | ICD-10-CM

## 2013-10-12 MED ORDER — LEVOTHYROXINE SODIUM 88 MCG PO TABS: 88.0000 ug | ORAL_TABLET | Freq: Every day | ORAL | Status: DC

## 2013-10-12 MED ORDER — ETONOGESTREL-ETHINYL ESTRADIOL 0.12-0.015 MG/24HR VA RING: VAGINAL_RING | VAGINAL | Status: DC

## 2013-10-12 NOTE — Progress Notes (Signed)
Subjective:    Molly [name] is a 33 y.o. MW G5P4A1 (10914,12,9,31 yo kids)  female who presents for an annual exam. The patient has no complaints today. She needs her TSH checked and synthroid renewed. The patient is not currently sexually active for months. GYN screening history: last pap: was normal. The patient wears seatbelts: yes. The patient participates in regular exercise: yes. (works as a Child psychotherapistwaitress) Has the patient ever been transfused or tattooed?: no. The patient reports that there is not domestic violence in her life.   Menstrual History: OB History   Grav Para Term Preterm Abortions TAB SAB Ect Mult Living   4 4 4  0 0 0 0 0 0 4      Menarche age: [age]  Patient's last menstrual period was 09/24/2013.    The following portions of the patient's history were reviewed and updated as appropriate: allergies, current medications, past family history, past medical history, past social history, past surgical history and problem list.  Review of Systems A comprehensive review of systems was negative. She declines flu vaccine today. Married for 3 years.   Objective:    BP 128/81  Pulse 87  Resp 16  Ht 5\' 7"  (1.702 m)  Wt 265 lb (120.203 kg)  BMI 41.50 kg/m2  LMP 09/24/2013  General Appearance:    Alert, cooperative, no distress, appears stated age  Head:    Normocephalic, without obvious abnormality, atraumatic  Eyes:    PERRL, conjunctiva/corneas clear, EOM's intact, fundi    benign, both eyes  Ears:    Normal TM's and external ear canals, both ears  Nose:   Nares normal, septum midline, mucosa normal, no drainage    or sinus tenderness  Throat:   Lips, mucosa, and tongue normal; teeth and gums normal  Neck:   Supple, symmetrical, trachea midline, no adenopathy;    thyroid:  no enlargement/tenderness/nodules; no carotid   bruit or JVD  Back:     Symmetric, no curvature, ROM normal, no CVA tenderness  Lungs:     Clear to auscultation bilaterally, respirations unlabored  Chest  Wall:    No tenderness or deformity   Heart:    Regular rate and rhythm, S1 and S2 normal, no murmur, rub   or gallop  Breast Exam:    No tenderness, masses, or nipple abnormality  Abdomen:     Soft, non-tender, bowel sounds active all four quadrants,    no masses, no organomegaly  Genitalia:    Normal female without lesion, discharge or tenderness     Extremities:   Extremities normal, atraumatic, no cyanosis or edema  Pulses:   2+ and symmetric all extremities  Skin:   Skin color, texture, turgor normal, no rashes or lesions  Lymph nodes:   Cervical, supraclavicular, and axillary nodes normal  Neurologic:   CNII-XII intact, normal strength, sensation and reflexes    throughout  .    Assessment:    Healthy female exam.    Plan:     Breast self exam technique reviewed and patient encouraged to perform self-exam monthly. Chlamydia specimen. GC specimen. Thin prep Pap smear.  with cotesting Check TSH and re order synthroid and Nuvaring

## 2013-10-13 LAB — TSH: TSH: 1.346 u[IU]/mL (ref 0.350–4.500)

## 2013-10-14 ENCOUNTER — Other Ambulatory Visit: Payer: 59

## 2013-10-14 LAB — CYTOLOGY - PAP

## 2013-10-20 ENCOUNTER — Telehealth: Payer: Self-pay | Admitting: *Deleted

## 2013-10-20 NOTE — Telephone Encounter (Signed)
Message copied by Granville LewisLARK, Lanett Lasorsa L on Tue Oct 20, 2013  8:30 AM ------      Message from: Allie BossierVE, MYRA C      Created: Mon Oct 19, 2013  4:24 PM       She needs a colpo for LGSIL.      Thanks ------

## 2013-10-20 NOTE — Telephone Encounter (Signed)
Pt notified of pap results.  She is scheduled for a colposcopy 10/27/13 @ 2:30 with Dr Marice Potterove.  Pap showed LGSIL

## 2013-10-27 ENCOUNTER — Encounter: Payer: Self-pay | Admitting: Obstetrics & Gynecology

## 2013-10-27 ENCOUNTER — Ambulatory Visit (INDEPENDENT_AMBULATORY_CARE_PROVIDER_SITE_OTHER): Payer: 59 | Admitting: Obstetrics & Gynecology

## 2013-10-27 VITALS — BP 132/80 | HR 78 | Resp 16 | Ht 67.0 in | Wt 265.0 lb

## 2013-10-27 DIAGNOSIS — N871 Moderate cervical dysplasia: Secondary | ICD-10-CM

## 2013-10-27 DIAGNOSIS — Z01812 Encounter for preprocedural laboratory examination: Secondary | ICD-10-CM

## 2013-10-27 LAB — POCT URINE PREGNANCY: Preg Test, Ur: NEGATIVE

## 2013-10-27 NOTE — Progress Notes (Signed)
   Subjective:    Patient ID: Molly [name], female    DOB: 06/18/1980, 33 y.o.   MRN: [id number]  HPI  33 yo lady here for a colpo. Her pap recently was LGSIL. Her pap last year was normal.   Review of Systems     Objective:   Physical Exam  UPT negative, consent signed, time out done Cervix prepped with acetic acid. Transformation zone seen in its entirety. Colpo adequate. Changes c/w LGSIL seen at the 11 and 1 o'clock positions (acetowhite changes and a very small amount of mosacism) I biopsied (and removed) the 1 o'clock lesion. Silver nitrate yielded hemostasis. ECC obtained. She tolerated the procedure well.        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

## 2013-11-02 ENCOUNTER — Telehealth: Payer: Self-pay | Admitting: *Deleted

## 2013-11-02 NOTE — Telephone Encounter (Signed)
Pt notified of cervical biopsy results and per Dr Marice Potterove just need to repeat her pap in 1 year due to the LGSIL

## 2013-11-09 ENCOUNTER — Encounter: Payer: Self-pay | Admitting: Obstetrics & Gynecology

## 2014-10-14 ENCOUNTER — Encounter: Payer: Self-pay | Admitting: Obstetrics & Gynecology

## 2014-10-14 ENCOUNTER — Ambulatory Visit (INDEPENDENT_AMBULATORY_CARE_PROVIDER_SITE_OTHER): Payer: 59 | Admitting: Obstetrics & Gynecology

## 2014-10-14 VITALS — BP 140/87 | HR 91 | Resp 16 | Ht 67.0 in | Wt 258.0 lb

## 2014-10-14 DIAGNOSIS — Z3044 Encounter for surveillance of vaginal ring hormonal contraceptive device: Secondary | ICD-10-CM

## 2014-10-14 DIAGNOSIS — Z124 Encounter for screening for malignant neoplasm of cervix: Secondary | ICD-10-CM

## 2014-10-14 DIAGNOSIS — Z113 Encounter for screening for infections with a predominantly sexual mode of transmission: Secondary | ICD-10-CM | POA: Diagnosis not present

## 2014-10-14 DIAGNOSIS — Z1151 Encounter for screening for human papillomavirus (HPV): Secondary | ICD-10-CM | POA: Diagnosis not present

## 2014-10-14 DIAGNOSIS — Z01419 Encounter for gynecological examination (general) (routine) without abnormal findings: Secondary | ICD-10-CM

## 2014-10-14 DIAGNOSIS — Z0000 Encounter for general adult medical examination without abnormal findings: Secondary | ICD-10-CM

## 2014-10-14 DIAGNOSIS — Z3049 Encounter for surveillance of other contraceptives: Secondary | ICD-10-CM

## 2014-10-14 MED ORDER — ETONOGESTREL-ETHINYL ESTRADIOL 0.12-0.015 MG/24HR VA RING: VAGINAL_RING | VAGINAL | Status: DC

## 2014-10-14 MED ORDER — LEVOTHYROXINE SODIUM 88 MCG PO TABS: 88.0000 ug | ORAL_TABLET | Freq: Every day | ORAL | Status: DC

## 2014-10-14 NOTE — Progress Notes (Signed)
Subjective:    Molly [name] is a 34 y.o. MW female who presents for an annual exam. The patient has no complaints today. The patient is sexually active. GYN screening history: last pap: was normal. The patient wears seatbelts: yes. The patient participates in regular exercise: yes. Has the patient ever been transfused or tattooed?: no. The patient reports that there is not domestic violence in her life.   Menstrual History: OB History    Gravida Para Term Preterm AB TAB SAB Ectopic Multiple Living   0 0 0 0 0 0 4      Menarche age: 60  Patient's last menstrual period was 09/14/2014.    The following portions of the patient's history were reviewed and updated as appropriate: allergies, current medications, past family history, past medical history, past social history, past surgical history and problem list.  Review of Systems Pertinent items noted in HPI and remainder of comprehensive ROS otherwise negative.  Married, denies dyspareunia. Declines flu vaccine   Objective:    BP 140/87 mmHg  Pulse 91  Resp 16  Ht  (1.702 m)  Wt 258 lb (117.028 kg)  BMI 40.40 kg/m2  LMP 09/14/2014  General Appearance:    Alert, cooperative, no distress, appears stated age  Head:    Normocephalic, without obvious abnormality, atraumatic  Eyes:    PERRL, conjunctiva/corneas clear, EOM's intact, fundi    benign, both eyes  Ears:    Normal TM's and external ear canals, both ears  Nose:   Nares normal, septum midline, mucosa normal, no drainage    or sinus tenderness  Throat:   Lips, mucosa, and tongue normal; teeth and gums normal  Neck:   Supple, symmetrical, trachea midline, no adenopathy;    thyroid:  no enlargement/tenderness/nodules; no carotid   bruit or JVD  Back:     Symmetric, no curvature, ROM normal, no CVA tenderness  Lungs:     Clear to auscultation bilaterally, respirations unlabored  Chest Wall:    No tenderness or deformity   Heart:    Regular rate and rhythm, S1 and  S2 normal, no murmur, rub   or gallop  Breast Exam:    No tenderness, masses, or nipple abnormality  Abdomen:     Soft, non-tender, bowel sounds active all four quadrants,    no masses, no organomegaly  Genitalia:    Normal female without lesion, discharge or tenderness, NSSA, NT, mobile, normal adnexal exam     Extremities:   Extremities normal, atraumatic, no cyanosis or edema  Pulses:   2+ and symmetric all extremities  Skin:   Skin color, texture, turgor normal, no rashes or lesions  Lymph nodes:   Cervical, supraclavicular, and axillary nodes normal  Neurologic:   CNII-XII intact, normal strength, sensation and reflexes    throughout  .    Assessment:    Healthy female exam.    Plan:     Breast self exam technique reviewed and patient encouraged to perform self-exam monthly. Chlamydia specimen. GC specimen. Thin prep Pap smear. tsh   Refill Nuvaring and synthroid

## 2014-10-15 LAB — TSH: TSH: 3.171 u[IU]/mL (ref 0.350–4.500)

## 2014-10-18 LAB — CYTOLOGY - PAP

## 2014-10-20 ENCOUNTER — Telehealth: Payer: Self-pay | Admitting: *Deleted

## 2014-10-20 NOTE — Telephone Encounter (Signed)
-----   Message from Allie BossierMyra C Dove, MD sent at 10/19/2014  3:33 PM EDT ----- She will need a colpo again. Thanks

## 2014-10-20 NOTE — Telephone Encounter (Signed)
LM on cell phone voicemail to call office to schedule Colpo with Dr Marice Potterove.

## 2014-11-08 ENCOUNTER — Encounter: Payer: Self-pay | Admitting: Obstetrics & Gynecology

## 2014-11-08 ENCOUNTER — Ambulatory Visit (INDEPENDENT_AMBULATORY_CARE_PROVIDER_SITE_OTHER): Payer: 59 | Admitting: Obstetrics & Gynecology

## 2014-11-08 VITALS — BP 134/87 | HR 89 | Resp 16 | Ht 67.0 in | Wt 258.0 lb

## 2014-11-08 DIAGNOSIS — Z01812 Encounter for preprocedural laboratory examination: Secondary | ICD-10-CM | POA: Diagnosis not present

## 2014-11-08 DIAGNOSIS — IMO0002 Reserved for concepts with insufficient information to code with codable children: Secondary | ICD-10-CM

## 2014-11-08 DIAGNOSIS — R87810 Cervical high risk human papillomavirus (HPV) DNA test positive: Secondary | ICD-10-CM

## 2014-11-08 DIAGNOSIS — R87612 Low grade squamous intraepithelial lesion on cytologic smear of cervix (LGSIL): Secondary | ICD-10-CM

## 2014-11-08 LAB — POCT URINE PREGNANCY: PREG TEST UR: NEGATIVE

## 2014-11-08 NOTE — Progress Notes (Signed)
   Subjective:    Patient ID: Molly RogersPriscilla A Stone, female    DOB: 10/22/1980, 34 y.o.   MRN: [id number]  HPI  34 yo lady here for a colposcopy. She had a normal pap in 2014, then LGSIL in 2015 with a colpo showing CIN1/2. Her biopsy at that time was CIN1. Her pap last month was LGSIL.  Review of Systems     Objective:   Physical Exam  WNWHWFNAD UPT negative, consent signed, time out done Cervix prepped with acetic acid. Transformation zone seen in its entirety. Colpo adequate. Changes c/w LGSIL seen in a circumferencial fashion at the os (acetowhite changes) ECC obtained. She tolerated the procedure well.         Assessment & Plan:  LGSIL:- await pathology

## 2014-11-23 ENCOUNTER — Ambulatory Visit (INDEPENDENT_AMBULATORY_CARE_PROVIDER_SITE_OTHER): Payer: 59 | Admitting: Obstetrics & Gynecology

## 2014-11-23 ENCOUNTER — Encounter: Payer: Self-pay | Admitting: *Deleted

## 2014-11-23 ENCOUNTER — Encounter: Payer: Self-pay | Admitting: Obstetrics & Gynecology

## 2014-11-23 VITALS — BP 136/88 | HR 73 | Resp 16 | Ht 67.0 in | Wt 260.0 lb

## 2014-11-23 DIAGNOSIS — R87612 Low grade squamous intraepithelial lesion on cytologic smear of cervix (LGSIL): Secondary | ICD-10-CM | POA: Diagnosis not present

## 2014-11-23 HISTORY — PX: LEEP: SHX91

## 2014-11-23 NOTE — Progress Notes (Signed)
   Subjective:    Patient ID: Molly RogersPriscilla A [name], female    DOB: 02/15/1980, 34 y.o.   MRN: [id number]  HPI  34 yo lady here for treatment of her +ECC, LGSIL. She was originally scheduled for a cryo until the ECC showed LGSIL.  Review of Systems     Objective:   Physical Exam UPT negative, consent signed, time out done Cervix prepped with acetic acid. Transformation zone seen in its entirety. Colpo adequate. Changes c/w LGSIL seen in a circumferencial fashion at the os (acetowhite changes) I then used 15 cc of 1% lidocaine to provide anesthesia. I used the apple core tip to do the LEEP. I then obtained an ECC. I cauterized the cervical bed with cautery.  Excellent hemostasis was noted. She tolerated the procedure well.        Assessment & Plan:  + ECC- now s/p LEEP Await pathology

## 2014-12-01 ENCOUNTER — Telehealth: Payer: Self-pay | Admitting: *Deleted

## 2014-12-01 NOTE — Telephone Encounter (Signed)
Pt notified of path report from LEEP and repeat pap in 1 year.

## 2014-12-21 ENCOUNTER — Encounter: Payer: Self-pay | Admitting: Obstetrics & Gynecology

## 2014-12-21 ENCOUNTER — Ambulatory Visit (INDEPENDENT_AMBULATORY_CARE_PROVIDER_SITE_OTHER): Payer: 59 | Admitting: Obstetrics & Gynecology

## 2014-12-21 VITALS — BP 146/90 | HR 76 | Temp 98.3°F | Resp 16 | Ht 67.0 in | Wt 264.0 lb

## 2014-12-21 DIAGNOSIS — Z9889 Other specified postprocedural states: Secondary | ICD-10-CM

## 2014-12-21 DIAGNOSIS — N870 Mild cervical dysplasia: Secondary | ICD-10-CM | POA: Diagnosis not present

## 2014-12-21 NOTE — Progress Notes (Signed)
   Subjective:    Patient ID: Molly RogersPriscilla A [name], female    DOB: 12/17/1980, 34 y.o.   MRN: [id number]  HPI 34 yo lady here for follow up after a LEEP. She has not had sex since. She has no complaints.  Review of Systems     Objective:   Physical Exam  WNWHWFNAD She appears to have a cold/URI. Her cervix is well-healed      Assessment & Plan:  S/p LEEP- doing well RTC 1 year for pap with cotesting

## 2015-10-24 ENCOUNTER — Telehealth: Payer: Self-pay | Admitting: General Practice

## 2015-10-24 NOTE — Telephone Encounter (Signed)
Patient called and left message requesting refill on nuvaring and synthroid.

## 2015-10-25 ENCOUNTER — Other Ambulatory Visit: Payer: Self-pay | Admitting: *Deleted

## 2015-10-25 DIAGNOSIS — Z3049 Encounter for surveillance of other contraceptives: Secondary | ICD-10-CM

## 2015-10-25 MED ORDER — LEVOTHYROXINE SODIUM 88 MCG PO TABS: ORAL_TABLET | ORAL | 0 refills | Status: DC

## 2015-10-25 MED ORDER — ETONOGESTREL-ETHINYL ESTRADIOL 0.12-0.015 MG/24HR VA RING: VAGINAL_RING | VAGINAL | 0 refills | Status: DC

## 2015-10-25 NOTE — Telephone Encounter (Signed)
Pt called requesting a RF on Synthroid and Nuvaring.  ! RF per RX given and needs an appt.

## 2015-10-31 ENCOUNTER — Other Ambulatory Visit: Payer: Self-pay | Admitting: *Deleted

## 2015-10-31 DIAGNOSIS — Z3049 Encounter for surveillance of other contraceptives: Secondary | ICD-10-CM

## 2015-10-31 MED ORDER — LEVOTHYROXINE SODIUM 88 MCG PO TABS: ORAL_TABLET | ORAL | 2 refills | Status: DC

## 2015-11-17 ENCOUNTER — Other Ambulatory Visit: Payer: Self-pay | Admitting: *Deleted

## 2015-11-17 DIAGNOSIS — Z309 Encounter for contraceptive management, unspecified: Secondary | ICD-10-CM

## 2015-11-17 DIAGNOSIS — Z3049 Encounter for surveillance of other contraceptives: Secondary | ICD-10-CM

## 2015-11-17 DIAGNOSIS — E039 Hypothyroidism, unspecified: Secondary | ICD-10-CM

## 2015-11-17 MED ORDER — ETONOGESTREL-ETHINYL ESTRADIOL 0.12-0.015 MG/24HR VA RING: VAGINAL_RING | VAGINAL | 0 refills | Status: DC

## 2015-11-17 MED ORDER — LEVOTHYROXINE SODIUM 88 MCG PO TABS: ORAL_TABLET | ORAL | 0 refills | Status: DC

## 2015-11-17 NOTE — Telephone Encounter (Signed)
Pt made appt with Dr Marice Potterove in Dec but needed a RF on Synthroid and Nuvaring until appt.

## 2015-12-15 ENCOUNTER — Ambulatory Visit (INDEPENDENT_AMBULATORY_CARE_PROVIDER_SITE_OTHER): Payer: Self-pay | Admitting: Obstetrics & Gynecology

## 2015-12-15 ENCOUNTER — Encounter: Payer: Self-pay | Admitting: Obstetrics & Gynecology

## 2015-12-15 VITALS — BP 140/98 | HR 96 | Ht 67.0 in | Wt 281.0 lb

## 2015-12-15 DIAGNOSIS — Z01419 Encounter for gynecological examination (general) (routine) without abnormal findings: Secondary | ICD-10-CM

## 2015-12-15 DIAGNOSIS — Z309 Encounter for contraceptive management, unspecified: Secondary | ICD-10-CM

## 2015-12-15 DIAGNOSIS — Z3049 Encounter for surveillance of other contraceptives: Secondary | ICD-10-CM

## 2015-12-15 MED ORDER — ETONOGESTREL-ETHINYL ESTRADIOL 0.12-0.015 MG/24HR VA RING: VAGINAL_RING | VAGINAL | 15 refills | Status: DC

## 2015-12-15 NOTE — Progress Notes (Signed)
Subjective:    Molly [name] is a 35 y.o. MW P4 female who presents for an annual exam. The patient has no complaints today except for feeling extra fatigue, always constipation.  The patient is sexually active. GYN screening history: last pap: was abnormal: LGSIL, Had LEEP for + ECC. The patient wears seatbelts: yes. The patient participates in regular exercise: yes. Has the patient ever been transfused or tattooed?: no. The patient reports that there is not domestic violence in her life.   Menstrual History: OB History    Gravida Para Term Preterm AB Living   4 4 4  0 0 4   SAB TAB Ectopic Multiple Live Births   0 0 0 0        Menarche age: [age] Patient's last menstrual period was 12/15/2015.    The following portions of the patient's history were reviewed and updated as appropriate: allergies, current medications, past family history, past medical history, past social history, past surgical history and problem list.  Review of Systems Pertinent items are noted in HPI.   FH- no breast/gyn/colon cancer, + DM Declines a flu vaccine Works at [employer] [place] [address]   Objective:    BP (!) 140/98   Pulse 96   Ht 5\' 7"  (1.702 m)   Wt 281 lb (127.5 kg)   LMP 12/15/2015   BMI 44.01 kg/m   General Appearance:    Alert, cooperative, no distress, appears stated age  Head:    Normocephalic, without obvious abnormality, atraumatic  Eyes:    PERRL, conjunctiva/corneas clear, EOM's intact, fundi    benign, both eyes  Ears:    Normal TM's and external ear canals, both ears  Nose:   Nares normal, septum midline, mucosa normal, no drainage    or sinus tenderness  Throat:   Lips, mucosa, and tongue normal; teeth and gums normal  Neck:   Supple, symmetrical, trachea midline, no adenopathy;    thyroid:  no enlargement/tenderness/nodules; no carotid   bruit or JVD  Back:     Symmetric, no curvature, ROM normal, no CVA tenderness  Lungs:     Clear to auscultation bilaterally, respirations  unlabored  Chest Wall:    No tenderness or deformity   Heart:    Regular rate and rhythm, S1 and S2 normal, no murmur, rub   or gallop  Breast Exam:    No tenderness, masses, or nipple abnormality  Abdomen:     Soft, non-tender, bowel sounds active all four quadrants,    no masses, no organomegaly  Genitalia:    Normal female without lesion, discharge or tenderness, cervix normal s/p LEEP, NSSA, NT, no palpable adnexal masses     Extremities:   Extremities normal, atraumatic, no cyanosis or edema  Pulses:   2+ and symmetric all extremities  Skin:   Skin color, texture, turgor normal, no rashes or lesions  Lymph nodes:   Cervical, supraclavicular, and axillary nodes normal  Neurologic:   CNII-XII intact, normal strength, sensation and reflexes    throughout   .    Assessment:    Healthy female exam.    Plan:     Thin prep Pap smear. with cotesting Refill of Nuvaring, also discussed BS for contraception/reduction of ovarian cancer risk

## 2015-12-16 ENCOUNTER — Other Ambulatory Visit (INDEPENDENT_AMBULATORY_CARE_PROVIDER_SITE_OTHER): Payer: 59

## 2015-12-16 DIAGNOSIS — Z01419 Encounter for gynecological examination (general) (routine) without abnormal findings: Secondary | ICD-10-CM | POA: Diagnosis not present

## 2015-12-16 LAB — CBC
HCT: 42.9 % (ref 35.0–45.0)
HEMOGLOBIN: 14.2 g/dL (ref 11.7–15.5)
MCH: 31.5 pg (ref 27.0–33.0)
MCHC: 33.1 g/dL (ref 32.0–36.0)
MCV: 95.1 fL (ref 80.0–100.0)
MPV: 9.9 fL (ref 7.5–12.5)
Platelets: 337 10*3/uL (ref 140–400)
RBC: 4.51 MIL/uL (ref 3.80–5.10)
RDW: 12.9 % (ref 11.0–15.0)
WBC: 8.2 10*3/uL (ref 3.8–10.8)

## 2015-12-17 LAB — VITAMIN D 25 HYDROXY (VIT D DEFICIENCY, FRACTURES): Vit D, 25-Hydroxy: 36 ng/mL (ref 30–100)

## 2015-12-17 LAB — COMPREHENSIVE METABOLIC PANEL
ALT: 44 U/L — AB (ref 6–29)
AST: 25 U/L (ref 10–30)
Albumin: 4.2 g/dL (ref 3.6–5.1)
Alkaline Phosphatase: 84 U/L (ref 33–115)
BILIRUBIN TOTAL: 0.4 mg/dL (ref 0.2–1.2)
BUN: 17 mg/dL (ref 7–25)
CO2: 21 mmol/L (ref 20–31)
CREATININE: 0.73 mg/dL (ref 0.50–1.10)
Calcium: 8.9 mg/dL (ref 8.6–10.2)
Chloride: 108 mmol/L (ref 98–110)
GLUCOSE: 59 mg/dL — AB (ref 65–99)
Potassium: 4.4 mmol/L (ref 3.5–5.3)
SODIUM: 141 mmol/L (ref 135–146)
Total Protein: 7 g/dL (ref 6.1–8.1)

## 2015-12-17 LAB — LIPID PANEL
Cholesterol: 158 mg/dL (ref <200)
HDL: 67 mg/dL (ref >50)
LDL CALC: 73 mg/dL (ref <100)
Total CHOL/HDL Ratio: 2.4 Ratio (ref <5.0)
Triglycerides: 92 mg/dL (ref <150)
VLDL: 18 mg/dL (ref <30)

## 2015-12-17 LAB — T4, FREE: FREE T4: 1.2 ng/dL (ref 0.8–1.8)

## 2015-12-17 LAB — TSH: TSH: 8.12 mIU/L — ABNORMAL HIGH

## 2015-12-17 LAB — T3, FREE: T3 FREE: 2.8 pg/mL (ref 2.3–4.2)

## 2015-12-20 ENCOUNTER — Telehealth: Payer: Self-pay | Admitting: *Deleted

## 2015-12-20 NOTE — Telephone Encounter (Signed)
Erroneous encounter

## 2015-12-21 ENCOUNTER — Telehealth: Payer: Self-pay

## 2015-12-21 DIAGNOSIS — E039 Hypothyroidism, unspecified: Secondary | ICD-10-CM

## 2015-12-21 DIAGNOSIS — Z3049 Encounter for surveillance of other contraceptives: Secondary | ICD-10-CM

## 2015-12-21 LAB — CYTOLOGY - PAP
ADEQUACY: ABSENT
CHLAMYDIA, DNA PROBE: NEGATIVE
DIAGNOSIS: NEGATIVE
HPV (WINDOPATH): NOT DETECTED
NEISSERIA GONORRHEA: NEGATIVE

## 2015-12-21 MED ORDER — LEVOTHYROXINE SODIUM 100 MCG PO TABS: ORAL_TABLET | ORAL | 6 refills | Status: DC

## 2015-12-21 NOTE — Telephone Encounter (Signed)
Left message on pt's phone explaining that her TSH was a little high and that her levothyroxine is increasing from 88mcg to 100mcg per Dr. Marice Potterove and it has been sent to the pharmacy

## 2016-06-28 ENCOUNTER — Encounter: Payer: Self-pay | Admitting: Obstetrics & Gynecology

## 2016-06-28 ENCOUNTER — Ambulatory Visit (INDEPENDENT_AMBULATORY_CARE_PROVIDER_SITE_OTHER): Payer: BLUE CROSS/BLUE SHIELD | Admitting: Obstetrics & Gynecology

## 2016-06-28 VITALS — BP 130/84 | HR 85 | Resp 16 | Ht 67.0 in | Wt 300.0 lb

## 2016-06-28 DIAGNOSIS — R635 Abnormal weight gain: Secondary | ICD-10-CM | POA: Diagnosis not present

## 2016-06-28 DIAGNOSIS — Z3049 Encounter for surveillance of other contraceptives: Secondary | ICD-10-CM

## 2016-06-28 DIAGNOSIS — Z309 Encounter for contraceptive management, unspecified: Secondary | ICD-10-CM

## 2016-06-28 LAB — T3, FREE: T3 FREE: 2.8 pg/mL (ref 2.3–4.2)

## 2016-06-28 LAB — TSH: TSH: 4.2 m[IU]/L

## 2016-06-28 LAB — T4, FREE: FREE T4: 1.2 ng/dL (ref 0.8–1.8)

## 2016-06-28 MED ORDER — ETONOGESTREL-ETHINYL ESTRADIOL 0.12-0.015 MG/24HR VA RING: VAGINAL_RING | VAGINAL | 15 refills | Status: DC

## 2016-06-28 NOTE — Progress Notes (Signed)
   Subjective:    Patient ID: Molly [name], female    DOB: 09/09/1980, 36 y.o.   MRN: [id number]  HPI 36 yo MW P4 here for follow up of her thyroid.She has gained 23# in the last 6 months, feels a lot of fatigue, has to take naps, and also feels some "pressure" in her throat, especially when lying down at night.   Review of Systems She has seen an endocrinologist in the past and had u/s of her throat (on [address]) She says that she cannot see him due to $    Objective:   Physical Exam Morbidly obese WF NAD Breathing, conversing, and ambulating normally Abd- benign       Assessment & Plan:  Symptoms of thyroid dysfunction Check TFTs RTC 12/18 for pap/annual (h/o LGSIL)

## 2016-06-29 ENCOUNTER — Telehealth: Payer: Self-pay | Admitting: *Deleted

## 2016-06-29 NOTE — Telephone Encounter (Signed)
LM on voicemail that her Thyroid test were normal and she needs to f/u with her PCP for the fatique and if she doesn't have one to call office for a referral.  A copy of labs mailed to pt.

## 2016-06-29 NOTE — Telephone Encounter (Signed)
-----   Message from Allie BossierMyra C Dove, MD sent at 06/29/2016 11:37 AM EDT ----- She had concerns about fatigue. Her TFTs were normal. Can you please refer her to a primary care MD? Thanks

## 2016-07-16 ENCOUNTER — Other Ambulatory Visit: Payer: Self-pay | Admitting: *Deleted

## 2016-07-16 DIAGNOSIS — Z3049 Encounter for surveillance of other contraceptives: Secondary | ICD-10-CM

## 2016-07-16 DIAGNOSIS — E039 Hypothyroidism, unspecified: Secondary | ICD-10-CM

## 2016-07-16 MED ORDER — LEVOTHYROXINE SODIUM 100 MCG PO TABS: ORAL_TABLET | ORAL | 6 refills | Status: DC

## 2016-07-16 NOTE — Telephone Encounter (Signed)
RF on [facility]th[facility] [address] per Dr Marice Potterove.

## 2017-02-20 ENCOUNTER — Encounter: Payer: Self-pay | Admitting: Obstetrics & Gynecology

## 2017-02-20 ENCOUNTER — Ambulatory Visit (INDEPENDENT_AMBULATORY_CARE_PROVIDER_SITE_OTHER): Payer: BLUE CROSS/BLUE SHIELD | Admitting: Obstetrics & Gynecology

## 2017-02-20 VITALS — BP 137/84 | HR 90 | Resp 16 | Ht 67.0 in | Wt 323.0 lb

## 2017-02-20 DIAGNOSIS — Z1151 Encounter for screening for human papillomavirus (HPV): Secondary | ICD-10-CM

## 2017-02-20 DIAGNOSIS — Z01419 Encounter for gynecological examination (general) (routine) without abnormal findings: Secondary | ICD-10-CM | POA: Diagnosis not present

## 2017-02-20 DIAGNOSIS — Z113 Encounter for screening for infections with a predominantly sexual mode of transmission: Secondary | ICD-10-CM | POA: Diagnosis not present

## 2017-02-20 DIAGNOSIS — Z124 Encounter for screening for malignant neoplasm of cervix: Secondary | ICD-10-CM

## 2017-02-20 MED ORDER — ETONOGESTREL-ETHINYL ESTRADIOL 0.12-0.015 MG/24HR VA RING: VAGINAL_RING | VAGINAL | 12 refills | Status: DC

## 2017-02-20 NOTE — Progress Notes (Signed)
Subjective:    Molly [name] is a 37 y.o. married P4 (17, 7115, 3612, and 807 yo kids) female who presents for an annual exam. The patient has no complaints today. The patient is not currently sexually active for more than a year  (relationship issues). She is having a physical relationship with someone else.  GYN screening history: last pap: was normal but I did a LEEP a year before that for HGSIL. The patient wears seatbelts: yes. The patient participates in regular exercise: no. Has the patient ever been transfused or tattooed?: no. The patient reports that there is not domestic violence in her life.   Menstrual History: OB History    Gravida Para Term Preterm AB Living   4 4 4  0 0 4   SAB TAB Ectopic Multiple Live Births   0 0 0 0        Menarche age: [age] Patient's last menstrual period was 02/02/2017.    The following portions of the patient's history were reviewed and updated as appropriate: allergies, current medications, past family history, past medical history, past social history, past surgical history and problem list.  Review of Systems Pertinent items are noted in HPI.   FH- no breast/gyn/colon cancer Works at [employer]. Has used Nuvaring in the past, would like a new prescription.  Does not want more kids, wants a BTL Declines flu vaccine   Objective:    BP 137/84   Pulse 90   Resp 16   Ht 5\' 7"  (1.702 m)   Wt (!) 323 lb (146.5 kg)   LMP 02/02/2017   BMI 50.59 kg/m   General Appearance:    Alert, cooperative, no distress, appears stated age  Head:    Normocephalic, without obvious abnormality, atraumatic  Eyes:    PERRL, conjunctiva/corneas clear, EOM's intact, fundi    benign, both eyes  Ears:    Normal TM's and external ear canals, both ears  Nose:   Nares normal, septum midline, mucosa normal, no drainage    or sinus tenderness  Throat:   Lips, mucosa, and tongue normal; teeth and gums normal  Neck:   Supple, symmetrical, trachea midline, no  adenopathy;    thyroid:  no enlargement/tenderness/nodules; no carotid   bruit or JVD  Back:     Symmetric, no curvature, ROM normal, no CVA tenderness  Lungs:     Clear to auscultation bilaterally, respirations unlabored  Chest Wall:    No tenderness or deformity   Heart:    Regular rate and rhythm, S1 and S2 normal, no murmur, rub   or gallop  Breast Exam:    No tenderness, masses, or nipple abnormality  Abdomen:     Soft, non-tender, bowel sounds active all four quadrants,    no masses, no organomegaly  Genitalia:    Normal female without lesion, discharge or tenderness, normal size and shape, anteverted, mobile, non-tender, normal adnexal exam      Extremities:   Extremities normal, atraumatic, no cyanosis or edema  Pulses:   2+ and symmetric all extremities  Skin:   Skin color, texture, turgor normal, no rashes or lesions  Lymph nodes:   Cervical, supraclavicular, and axillary nodes normal  Neurologic:   CNII-XII intact, normal strength, sensation and reflexes    throughout  .    Assessment:    Healthy female exam.    Plan:     Thin prep Pap smear. with cotesting STI testing I sent Molly BleacherJacinda a message to schedule  a BS She will start Nuvaring with her NMP

## 2017-02-21 LAB — CYTOLOGY - PAP
BACTERIAL VAGINITIS: POSITIVE — AB
CANDIDA VAGINITIS: NEGATIVE
Chlamydia: NEGATIVE
Diagnosis: NEGATIVE
HPV (WINDOPATH): NOT DETECTED
Neisseria Gonorrhea: NEGATIVE
TRICH (WINDOWPATH): NEGATIVE

## 2017-02-21 LAB — HIV ANTIBODY (ROUTINE TESTING W REFLEX): HIV: NONREACTIVE

## 2017-02-21 LAB — HEPATITIS C ANTIBODY
Hepatitis C Ab: NONREACTIVE
SIGNAL TO CUT-OFF: 0.37 (ref <1.00)

## 2017-02-21 LAB — RPR: RPR: NONREACTIVE

## 2017-02-21 LAB — HEPATITIS B SURFACE ANTIGEN: HEP B S AG: NONREACTIVE

## 2017-02-25 ENCOUNTER — Telehealth: Payer: Self-pay | Admitting: *Deleted

## 2017-02-25 MED ORDER — METRONIDAZOLE 500 MG PO TABS: 500.0000 mg | ORAL_TABLET | Freq: Two times a day (BID) | ORAL | 0 refills | Status: DC

## 2017-02-25 NOTE — Telephone Encounter (Signed)
Pt notified of normal pap but she does have BV and [facility] [place] per Dr Marice Potterove.

## 2017-03-12 ENCOUNTER — Encounter (HOSPITAL_COMMUNITY): Payer: Self-pay

## 2017-05-13 ENCOUNTER — Encounter (HOSPITAL_COMMUNITY): Payer: Self-pay

## 2017-05-22 ENCOUNTER — Ambulatory Visit: Admit: 2017-05-22 | Payer: BLUE CROSS/BLUE SHIELD | Admitting: Obstetrics & Gynecology

## 2017-05-22 SURGERY — SALPINGECTOMY, BILATERAL, LAPAROSCOPIC
Anesthesia: Choice | Laterality: Bilateral

## 2017-06-09 ENCOUNTER — Other Ambulatory Visit: Payer: Self-pay

## 2017-06-09 ENCOUNTER — Encounter (HOSPITAL_COMMUNITY): Payer: Self-pay | Admitting: Emergency Medicine

## 2017-06-09 ENCOUNTER — Ambulatory Visit (HOSPITAL_COMMUNITY)
Admission: EM | Admit: 2017-06-09 | Discharge: 2017-06-09 | Disposition: A | Discharge status: Home / Self Care | Payer: BLUE CROSS/BLUE SHIELD | Attending: Urgent Care | Admitting: Urgent Care

## 2017-06-09 DIAGNOSIS — R21 Rash and other nonspecific skin eruption: Secondary | ICD-10-CM

## 2017-06-09 DIAGNOSIS — L239 Allergic contact dermatitis, unspecified cause: Secondary | ICD-10-CM

## 2017-06-09 DIAGNOSIS — L299 Pruritus, unspecified: Secondary | ICD-10-CM

## 2017-06-09 DIAGNOSIS — L298 Other pruritus: Secondary | ICD-10-CM

## 2017-06-09 DIAGNOSIS — R070 Pain in throat: Secondary | ICD-10-CM

## 2017-06-09 DIAGNOSIS — L259 Unspecified contact dermatitis, unspecified cause: Secondary | ICD-10-CM

## 2017-06-09 MED ORDER — METHYLPREDNISOLONE ACETATE 80 MG/ML IJ SUSP
80.0000 mg | Freq: Once | INTRAMUSCULAR | Status: AC
  Administered 2017-06-09: 80 mg via INTRAMUSCULAR

## 2017-06-09 MED ORDER — HYDROXYZINE HCL 25 MG PO TABS: 25.0000 mg | ORAL_TABLET | Freq: Every evening | ORAL | 0 refills | Status: DC | PRN

## 2017-06-09 MED ORDER — METHYLPREDNISOLONE ACETATE 80 MG/ML IJ SUSP
INTRAMUSCULAR | Status: AC
  Filled 2017-06-09: qty 1

## 2017-06-09 NOTE — Discharge Instructions (Signed)
You may use Vistaril at night for itching.  It is okay for use 25 to 50 mg.

## 2017-06-09 NOTE — ED Provider Notes (Signed)
  MRN: [id number] DOB: 05/19/1980  Subjective:   Molly [name] is a 37 y.o. female presenting for several day history of worsening facial rash that is spreading down to her neck.  She also reports that she feels like her throat is swelling.  Has facial itching as well over her rash.  Rash started from using an acne lotion on her face.  Felt like she had swelling of her eyes the next day together with the rash but the swelling has improved over her eyes.  Denies shortness of breath, chest tightness, wheezing, nausea, vomiting, belly pain.  No current facility-administered medications for this encounter.   Current Outpatient Medications:  .  etonogestrel-ethinyl estradiol (NUVARING) 0.12-0.015 MG/24HR vaginal ring, Use as directed, Disp: 1 each, Rfl: 15 .  etonogestrel-ethinyl estradiol (NUVARING) 0.12-0.015 MG/24HR vaginal ring, Insert vaginally and leave in place for 3 consecutive weeks, then remove for 1 week., Disp: 1 each, Rfl: 12 .  ibuprofen (ADVIL,MOTRIN) 200 MG tablet, Take by mouth., Disp: , Rfl:  .  levothyroxine (SYNTHROID, LEVOTHROID) 125 MCG tablet, , Disp: , Rfl: 0 .  metroNIDAZOLE (FLAGYL) 500 MG tablet, Take 1 tablet (500 mg total) by mouth 2 (two) times daily., Disp: 14 tablet, Rfl: 0   No Known Allergies  Past Medical History:  Diagnosis Date  . Constipation    abdominal pain  . Morbid obesity (HCC)   . Vaginal Pap smear, abnormal      Past Surgical History:  Procedure Laterality Date  . CHOLECYSTECTOMY    . ESOPHAGOGASTRODUODENOSCOPY  03/07/2011   Procedure: ESOPHAGOGASTRODUODENOSCOPY (EGD);  Surgeon: Freddy JakschWilliam M Outlaw, MD;  [facility];  Service: Endoscopy;  Laterality: N/A;  . TONSILLECTOMY    . WISDOM TOOTH EXTRACTION      Objective:   Vitals: BP 124/80 (BP Location: Left Arm) Comment (BP Location): large cuff  Pulse 87   Temp 98.5 F (36.9 C) (Oral)   Resp 20   LMP 05/22/2017   SpO2 100%   Physical Exam  Constitutional: She is oriented to  person, place, and time. She appears well-developed and well-nourished.  HENT:  Mouth/Throat: Oropharynx is clear and moist.  Throat is patent.  Eyes: Right eye exhibits no discharge. Left eye exhibits no discharge. No scleral icterus.  Cardiovascular: Normal rate, regular rhythm and intact distal pulses. Exam reveals no gallop and no friction rub.  No murmur heard. Pulmonary/Chest: No respiratory distress. She has no wheezes. She has no rales.  Neurological: She is alert and oriented to person, place, and time.  Skin: Skin is warm and dry. Rash (urticarial patches over perimeter of face sparing eyes and mouth extending to her lateral neck and underside of chin) noted.   Assessment and Plan :   Rash and nonspecific skin eruption  Itching  Contact dermatitis, unspecified contact dermatitis type, unspecified trigger  Throat discomfort  IM Depo-Medrol in clinic for facial rash, Vistaril for severe itching. Counseled patient on potential for adverse effects with medications prescribed today, patient verbalized understanding. Return-to-clinic precautions discussed, patient verbalized understanding.      Wallis BambergMani, Isrrael Fluckiger, [place]PA-C 06/09/17 1948

## 2017-06-09 NOTE — ED Triage Notes (Addendum)
Burning, itching rash to face.  Patient put an acne lotion on face, woke the next morning with some swelling to eyes.  That night noticed red skin and itchy skin.    No difficulty breathing. Feels uvula is swollen

## 2017-07-08 ENCOUNTER — Encounter (HOSPITAL_COMMUNITY): Payer: Self-pay | Admitting: Anesthesiology

## 2017-07-16 ENCOUNTER — Ambulatory Visit (HOSPITAL_COMMUNITY)
Admission: RE | Admit: 2017-07-16 | Payer: BLUE CROSS/BLUE SHIELD | Source: Ambulatory Visit | Admitting: Obstetrics & Gynecology

## 2017-07-16 SURGERY — SALPINGECTOMY, BILATERAL, LAPAROSCOPIC
Anesthesia: Choice | Laterality: Bilateral

## 2017-11-27 ENCOUNTER — Telehealth: Payer: Self-pay | Admitting: *Deleted

## 2017-11-27 NOTE — Telephone Encounter (Signed)
Left patient a message x 2 that she does not need an annual visit on 12/03/17. She had an annual on 02/20/17. Appointment will be canceled prior to 12/03/17 if no call back in reference to changing what type of appointment she needs is made.

## 2017-12-03 ENCOUNTER — Ambulatory Visit: Payer: BLUE CROSS/BLUE SHIELD | Admitting: Advanced Practice Midwife

## 2018-03-11 ENCOUNTER — Encounter: Payer: Self-pay | Admitting: Certified Nurse Midwife

## 2018-03-11 ENCOUNTER — Ambulatory Visit: Payer: Self-pay | Admitting: Certified Nurse Midwife

## 2018-03-11 VITALS — BP 122/72 | HR 80 | Ht 67.0 in | Wt 276.0 lb

## 2018-03-11 DIAGNOSIS — Z30015 Encounter for initial prescription of vaginal ring hormonal contraceptive: Secondary | ICD-10-CM

## 2018-03-11 DIAGNOSIS — Z01419 Encounter for gynecological examination (general) (routine) without abnormal findings: Secondary | ICD-10-CM

## 2018-03-11 MED ORDER — ETONOGESTREL-ETHINYL ESTRADIOL 0.12-0.015 MG/24HR VA RING: VAGINAL_RING | VAGINAL | 3 refills | Status: DC

## 2018-03-11 NOTE — Progress Notes (Addendum)
History:  Molly [name] is a 38 y.o. [phone] who presents to clinic today for annual well woman visit and to discuss birth control options.  Was on Nuvaring until 5 months ago, but it is too expensive without insurance and would like something cheaper.     The following portions of the patient's history were reviewed and updated as appropriate: allergies, current medications, family history, past medical history, social history, past surgical history and problem list.  Review of Systems:  Review of Systems  Constitutional: Negative.   Eyes: Negative.   Respiratory: Negative.   Cardiovascular: Negative.   Gastrointestinal: Negative.   Genitourinary: Negative.   Musculoskeletal: Negative.   Neurological: Negative.   Psychiatric/Behavioral: Negative.      Objective:  Physical Exam BP 122/72 (BP Location: Left Arm, Patient Position: Sitting, Cuff Size: Large)   Pulse 80   Ht 5\' 7"  (1.702 m)   Wt 276 lb (125.2 kg)   LMP 02/27/2018 (Exact Date)   BMI 43.23 kg/m  Physical Exam Constitutional:      Appearance: Normal appearance. She is obese.  HENT:     Head: Normocephalic and atraumatic.     Nose: Nose normal.     Mouth/Throat:     Mouth: Mucous membranes are moist.  Eyes:     Extraocular Movements: Extraocular movements intact.     Conjunctiva/sclera: Conjunctivae normal.     Pupils: Pupils are equal, round, and reactive to light.  Neck:     Musculoskeletal: Normal range of motion and neck supple.  Cardiovascular:     Rate and Rhythm: Normal rate and regular rhythm.  Pulmonary:     Effort: Pulmonary effort is normal.  Abdominal:     General: Abdomen is flat. Bowel sounds are normal.     Palpations: Abdomen is soft.  Musculoskeletal: Normal range of motion.  Skin:    General: Skin is warm and dry.     Capillary Refill: Capillary refill takes less than 2 seconds.  Neurological:     General: No focal deficit present.     Mental Status: She is alert and oriented to  person, place, and time.  Psychiatric:        Mood and Affect: Mood normal.        Behavior: Behavior normal.        Thought Content: Thought content normal.        Judgment: Judgment normal.    Assessment & Plan:  1. Well woman exam with routine gynecological exam Last pap smear negative with negative HPV in 2019.  Next pap smear due in 2024 per guidelines.  Well woman exam performed today.  F/U in 1 year for annual.  2. Encounter for initial prescription of vaginal ring hormonal contraceptive Prefers nuvaring to pills, but would like something cheaper than namebrand Nuvaring.  Generic nuvaring available and appears to be more affordable for patient.  Generic for nuvaring sent to pharmacy.  F/U in 1 year for birth control refill.   Molly [name], SNP 03/11/2018 3:42 PM

## 2018-03-12 ENCOUNTER — Encounter: Payer: Self-pay | Admitting: Certified Nurse Midwife

## 2018-12-22 ENCOUNTER — Other Ambulatory Visit: Payer: Self-pay | Admitting: Endocrinology

## 2018-12-22 DIAGNOSIS — N6320 Unspecified lump in the left breast, unspecified quadrant: Secondary | ICD-10-CM

## 2018-12-31 ENCOUNTER — Ambulatory Visit
Admission: RE | Admit: 2018-12-31 | Discharge: 2018-12-31 | Disposition: A | Discharge status: Home / Self Care | Payer: 59 | Source: Ambulatory Visit | Attending: Endocrinology | Admitting: Endocrinology

## 2018-12-31 ENCOUNTER — Other Ambulatory Visit: Payer: Self-pay

## 2018-12-31 ENCOUNTER — Other Ambulatory Visit: Payer: Self-pay | Admitting: Family Medicine

## 2018-12-31 DIAGNOSIS — N6320 Unspecified lump in the left breast, unspecified quadrant: Secondary | ICD-10-CM

## 2020-03-22 ENCOUNTER — Encounter: Payer: Self-pay | Admitting: Certified Nurse Midwife

## 2020-03-22 ENCOUNTER — Ambulatory Visit (INDEPENDENT_AMBULATORY_CARE_PROVIDER_SITE_OTHER): Payer: BC Managed Care – PPO | Admitting: Certified Nurse Midwife

## 2020-03-22 ENCOUNTER — Other Ambulatory Visit: Payer: Self-pay

## 2020-03-22 ENCOUNTER — Other Ambulatory Visit (HOSPITAL_COMMUNITY)
Admission: RE | Admit: 2020-03-22 | Discharge: 2020-03-22 | Disposition: A | Discharge status: Home / Self Care | Payer: BC Managed Care – PPO | Source: Ambulatory Visit | Attending: Certified Nurse Midwife | Admitting: Certified Nurse Midwife

## 2020-03-22 VITALS — BP 150/83 | HR 83 | Resp 16 | Ht 67.0 in | Wt 282.0 lb

## 2020-03-22 DIAGNOSIS — N760 Acute vaginitis: Secondary | ICD-10-CM | POA: Insufficient documentation

## 2020-03-22 DIAGNOSIS — Z01419 Encounter for gynecological examination (general) (routine) without abnormal findings: Secondary | ICD-10-CM | POA: Diagnosis present

## 2020-03-22 DIAGNOSIS — Z30018 Encounter for initial prescription of other contraceptives: Secondary | ICD-10-CM

## 2020-03-22 DIAGNOSIS — B9689 Other specified bacterial agents as the cause of diseases classified elsewhere: Secondary | ICD-10-CM | POA: Diagnosis not present

## 2020-03-22 DIAGNOSIS — Z113 Encounter for screening for infections with a predominantly sexual mode of transmission: Secondary | ICD-10-CM | POA: Insufficient documentation

## 2020-03-22 LAB — POCT URINE PREGNANCY: Preg Test, Ur: NEGATIVE

## 2020-03-22 MED ORDER — ETONOGESTREL-ETHINYL ESTRADIOL 0.12-0.015 MG/24HR VA RING: VAGINAL_RING | VAGINAL | 4 refills | Status: DC

## 2020-03-22 NOTE — Progress Notes (Signed)
GYNECOLOGY ANNUAL PREVENTATIVE CARE ENCOUNTER NOTE  History:     Molly [name] is a 40 y.o. [phone] female here for a routine annual gynecologic exam.  Current complaints: request STD screening and wants to get back on Nuvaring for contraception. Patient reports no intercourse over the past 3 weeks.   Denies abnormal vaginal bleeding, discharge, pelvic pain, problems with intercourse or other gynecologic concerns.    Gynecologic History No LMP recorded. Contraception: none Last Pap: 02/2017. Results were: normal with negative HPV  Obstetric History OB History  Gravida Para Term Preterm AB Living  4 4 4  0 0 4  SAB IAB Ectopic Multiple Live Births  0 0 0 0      # Outcome Date GA Lbr Len/2nd Weight Sex Delivery Anes PTL Lv  4 Term           3 Term           2 Term           1 Term             Past Medical History:  Diagnosis Date  . Constipation    abdominal pain  . Morbid obesity (HCC)   . Vaginal Pap smear, abnormal     Past Surgical History:  Procedure Laterality Date  . CHOLECYSTECTOMY    . ESOPHAGOGASTRODUODENOSCOPY  03/07/2011   Procedure: ESOPHAGOGASTRODUODENOSCOPY (EGD);  Surgeon: 03/09/2011, MD;  [facility];  Service: Endoscopy;  Laterality: N/A;  . TONSILLECTOMY    . WISDOM TOOTH EXTRACTION      Current Outpatient Medications on File Prior to Visit  Medication Sig Dispense Refill  . levothyroxine (SYNTHROID) 137 MCG tablet TAKE 1 TABLET(137 MCG) BY MOUTH DAILY    . hydrOXYzine (ATARAX/VISTARIL) 25 MG tablet Take 1-2 tablets (25-50 mg total) by mouth at bedtime as needed for itching. (Patient not taking: No sig reported) 30 tablet 0  . ibuprofen (ADVIL,MOTRIN) 200 MG tablet Take by mouth. (Patient not taking: Reported on 03/22/2020)     No current facility-administered medications on file prior to visit.    No Known Allergies  Social History:  reports that she has been smoking cigarettes. She has a 2.50 pack-year smoking history. She  has never used smokeless tobacco. She reports that she does not drink alcohol and does not use drugs.  Family History  Problem Relation Age of Onset  . Diabetes Other   . Anesthesia problems Neg Hx   . Hypotension Neg Hx   . Malignant hyperthermia Neg Hx   . Pseudochol deficiency Neg Hx     The following portions of the patient's history were reviewed and updated as appropriate: allergies, current medications, past family history, past medical history, past social history, past surgical history and problem list.  Review of Systems Pertinent items noted in HPI and remainder of comprehensive ROS otherwise negative.  Physical Exam:  BP (!) 150/83   Pulse 83   Resp 16   Ht 5\' 7"  (1.702 m)   Wt 282 lb (127.9 kg)   BMI 44.17 kg/m  CONSTITUTIONAL: Well-developed, well-nourished female in no acute distress.  HENT:  Normocephalic, atraumatic, External right and left ear normal.  EYES: Conjunctivae and EOM are normal. Pupils are equal, round, and reactive to light.  NECK: Normal range of motion, supple, no masses.  Normal thyroid.  SKIN: Skin is warm and dry. No rash noted. Not diaphoretic. No erythema. No pallor. MUSCULOSKELETAL: Normal range of motion. No tenderness.  No  cyanosis, clubbing, or edema. NEUROLOGIC: Alert and oriented to person, place, and time. Normal reflexes, muscle tone coordination.  PSYCHIATRIC: Normal mood and affect. Normal behavior. Normal judgment and thought content. CARDIOVASCULAR: Normal heart rate noted, regular rhythm RESPIRATORY: Clear to auscultation bilaterally. Effort and breath sounds normal, no problems with respiration noted. BREASTS: Symmetric in size. No masses, tenderness, skin changes, nipple drainage, or lymphadenopathy bilaterally. Inverted nipples. Performed in the presence of a chaperone. ABDOMEN: Soft, no distention noted.  No tenderness, rebound or guarding.  PELVIC: Normal appearing external genitalia and urethral meatus; normal appearing  vaginal mucosa and cervix.  No abnormal discharge noted.  Pap smear obtained.  Normal uterine size, no other palpable masses, no uterine or adnexal tenderness.  Performed in the presence of a chaperone.   Assessment and Plan:    1. Women's annual routine gynecological examination - Normal well woman examination  - Cytology - PAP( [place])  2. Screening for STDs (sexually transmitted diseases) - Patient request STD screening today  - Cervicovaginal ancillary only( [place]) - HIV antibody (with reflex) - RPR - Hepatitis C Antibody - Hepatitis B Surface AntiGEN  3. Encounter for prescription for nuvaring - UPT negative in office today  - etonogestrel-ethinyl estradiol (NUVARING) 0.12-0.015 MG/24HR vaginal ring; Insert vaginally and leave in place for 3 consecutive weeks, then remove for 1 week.  Dispense: 3 each; Refill: 4   Will follow up results of pap smear/STD screening and manage accordingly. Routine preventative health maintenance measures emphasized. Please refer to After Visit Summary for other counseling recommendations.     Sharyon Cable, CNM [facility] [employer], [facility]

## 2020-03-23 LAB — HEPATITIS C ANTIBODY
Hepatitis C Ab: NONREACTIVE
SIGNAL TO CUT-OFF: 0.22 (ref <1.00)

## 2020-03-23 LAB — CERVICOVAGINAL ANCILLARY ONLY
Bacterial Vaginitis (gardnerella): POSITIVE — AB
Candida Glabrata: NEGATIVE
Candida Vaginitis: NEGATIVE
Chlamydia: NEGATIVE
Comment: NEGATIVE
Comment: NEGATIVE
Comment: NEGATIVE
Comment: NEGATIVE
Comment: NEGATIVE
Comment: NORMAL
Neisseria Gonorrhea: NEGATIVE
Trichomonas: NEGATIVE

## 2020-03-23 LAB — HEPATITIS B SURFACE ANTIGEN: Hepatitis B Surface Ag: NONREACTIVE

## 2020-03-23 LAB — RPR: RPR Ser Ql: NONREACTIVE

## 2020-03-23 LAB — HIV ANTIBODY (ROUTINE TESTING W REFLEX): HIV 1&2 Ab, 4th Generation: NONREACTIVE

## 2020-03-24 LAB — CYTOLOGY - PAP
Comment: NEGATIVE
Diagnosis: NEGATIVE
High risk HPV: NEGATIVE

## 2020-05-13 ENCOUNTER — Emergency Department (HOSPITAL_COMMUNITY)
Admission: EM | Admit: 2020-05-13 | Discharge: 2020-05-13 | Disposition: A | Discharge status: Home / Self Care | Payer: BC Managed Care – PPO | Attending: Emergency Medicine | Admitting: Emergency Medicine

## 2020-05-13 ENCOUNTER — Emergency Department (HOSPITAL_COMMUNITY): Payer: BC Managed Care – PPO

## 2020-05-13 ENCOUNTER — Other Ambulatory Visit: Payer: Self-pay

## 2020-05-13 ENCOUNTER — Encounter (HOSPITAL_COMMUNITY): Payer: Self-pay

## 2020-05-13 DIAGNOSIS — J029 Acute pharyngitis, unspecified: Secondary | ICD-10-CM | POA: Diagnosis not present

## 2020-05-13 DIAGNOSIS — F17210 Nicotine dependence, cigarettes, uncomplicated: Secondary | ICD-10-CM | POA: Diagnosis not present

## 2020-05-13 DIAGNOSIS — R070 Pain in throat: Secondary | ICD-10-CM | POA: Diagnosis present

## 2020-05-13 LAB — MONONUCLEOSIS SCREEN: Mono Screen: NEGATIVE

## 2020-05-13 MED ORDER — DEXAMETHASONE 4 MG PO TABS
10.0000 mg | ORAL_TABLET | Freq: Once | ORAL | Status: AC
  Administered 2020-05-13: 10 mg via ORAL
  Filled 2020-05-13: qty 2

## 2020-05-13 NOTE — ED Provider Notes (Signed)
[facility]-EMERGENCY DEPT Provider Note   CSN: [id number] Arrival date & time: 05/13/20  0122     History Chief Complaint  Patient presents with  . Sore Throat    Molly [name] is a 40 y.o. female.  Patient to ED with sore throat for several days. She has had upper respiratory symptoms for the past 2 weeks but denies fever at any time. She has taken a home COVID test that was negative. She was seen at a clinic yesterday and had a flu and strep test that were also negative. She was given a prescription for prednisone but didn't fill it because she wanted further evaluation.   The history is provided by the patient. No language interpreter was used.  Sore Throat Pertinent negatives include no abdominal pain.       Past Medical History:  Diagnosis Date  . Constipation    abdominal pain  . Morbid obesity (HCC)   . Vaginal Pap smear, abnormal     Patient Active Problem List   Diagnosis Date Noted  . 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 Chlamydia 10/04/2012  . S/P cholecystectomy 09/24/2012  . Encounter for prescription for nuvaring 09/24/2012  . Obesity 09/24/2012    Past Surgical History:  Procedure Laterality Date  . CHOLECYSTECTOMY    . ESOPHAGOGASTRODUODENOSCOPY  03/07/2011   Procedure: ESOPHAGOGASTRODUODENOSCOPY (EGD);  Surgeon: [facility], MD;  Location: [facility];  Service: Endoscopy;  Laterality: N/A;  . TONSILLECTOMY    . WISDOM TOOTH EXTRACTION       OB History    Gravida  4   Para  4   Term  4   Preterm  0   AB  0   Living  4     SAB  0   IAB  0   Ectopic  0   Multiple  0   Live Births              Family History  Problem Relation Age of Onset  . Diabetes Other   . Anesthesia problems Neg Hx   . Hypotension Neg Hx   . Malignant hyperthermia Neg Hx   . Pseudochol deficiency Neg Hx     Social History   Tobacco Use  . Smoking status: Current Every Day Smoker    Packs/day: 0.50    Years:  5.00    Pack years: 2.50    Types: Cigarettes  . Smokeless tobacco: Never Used  Vaping Use  . Vaping Use: Never used  Substance Use Topics  . Alcohol use: No  . Drug use: No    Home Medications Prior to Admission medications   Medication Sig Start Date End Date Taking? Authorizing Provider  etonogestrel-ethinyl estradiol (NUVARING) 0.12-0.015 MG/24HR vaginal ring Insert vaginally and leave in place for 3 consecutive weeks, then remove for 1 week. 03/22/20  Yes Sharyon Cable, CNM  levothyroxine (SYNTHROID) 137 MCG tablet TAKE 1 TABLET(137 MCG) BY MOUTH DAILY 03/04/20  Yes [provider]  Pseudoephedrine-Ibuprofen (ADVIL COLD/SINUS) 30-200 MG TABS Take 1 tablet by mouth daily as needed.   Yes [provider]    Allergies    Patient has no known allergies.  Review of Systems   Review of Systems  Constitutional: Negative for appetite change, chills and fever.  HENT: Positive for congestion and sore throat. Negative for trouble swallowing.   Respiratory: Positive for cough.   Gastrointestinal: Negative for abdominal pain and nausea.  Skin: Negative for rash.  Physical Exam Updated Vital Signs BP (!) 148/100 (BP Location: Right Arm)   Pulse 88   Temp 98.9 F (37.2 C) (Oral)   Resp 16   Ht 5\' 7"  (1.702 m)   Wt 124.7 kg   SpO2 100%   BMI 43.07 kg/m   Physical Exam Vitals and nursing note reviewed.  Constitutional:      Appearance: She is well-developed. She is obese.  HENT:     Head: Normocephalic.     Mouth/Throat:     Mouth: Mucous membranes are moist. No oral lesions.     Pharynx: Uvula midline. Posterior oropharyngeal erythema present. No pharyngeal swelling or oropharyngeal exudate.  Cardiovascular:     Rate and Rhythm: Normal rate and regular rhythm.     Heart sounds: No murmur heard.   Pulmonary:     Effort: Pulmonary effort is normal.     Breath sounds: No wheezing, rhonchi or rales.  Abdominal:     Palpations: Abdomen is soft.   Musculoskeletal:     Cervical back: Normal range of motion and neck supple.  Skin:    General: Skin is warm and dry.  Neurological:     Mental Status: She is alert.     ED Results / Procedures / Treatments   Labs (all labs ordered are listed, but only abnormal results are displayed) Labs Reviewed - No data to display Results for orders placed or performed during the hospital encounter of 05/13/20  Mononucleosis screen  Result Value Ref Range   Mono Screen NEGATIVE NEGATIVE    EKG None  Radiology No results found. DG Chest 2 View  Result Date: 05/13/2020 CLINICAL DATA:  Sick for 2 weeks.  Cough EXAM: CHEST - 2 VIEW COMPARISON:  None. FINDINGS: The heart size and mediastinal contours are within normal limits. Both lungs are clear. The visualized skeletal structures are unremarkable. IMPRESSION: Negative chest. Electronically Signed   By: 07/13/2020 M.D.   On: 05/13/2020 05:43    Procedures Procedures   Medications Ordered in ED Medications - No data to display  ED Course  I have reviewed the triage vital signs and the nursing notes.  Pertinent labs & imaging results that were available during my care of the patient were reviewed by me and considered in my medical decision making (see chart for details).    MDM Rules/Calculators/A&P                          Patient to ED with ST as detailed in the HPI.   Mono and CXR negative. Patient reassured regarding likely viral nature of sore throat. She is felt appropriate for discharge home.  Final Clinical Impression(s) / ED Diagnoses Final diagnoses:  None   1. Pharyngitis  Rx / DC Orders ED Discharge Orders    None       07/13/2020, PA-C 05/13/20 07/13/20    3474, DO 05/21/20 1509

## 2020-05-13 NOTE — Discharge Instructions (Addendum)
Follow up with your doctor for recheck if symptoms persist. Return to the ED with any new or concerning symptoms.

## 2020-05-13 NOTE — ED Triage Notes (Signed)
Pt reports sore throat and painful when swallowing for 3 days. Pt reports feeling ill for 2 weeks. Seen by [facility] yesterday.

## 2020-08-23 ENCOUNTER — Ambulatory Visit: Payer: Self-pay

## 2021-03-07 DIAGNOSIS — E063 Autoimmune thyroiditis: Secondary | ICD-10-CM | POA: Diagnosis not present

## 2021-03-07 DIAGNOSIS — R300 Dysuria: Secondary | ICD-10-CM | POA: Diagnosis not present

## 2021-03-07 DIAGNOSIS — E038 Other specified hypothyroidism: Secondary | ICD-10-CM | POA: Diagnosis not present

## 2021-03-07 DIAGNOSIS — Z5941 Food insecurity: Secondary | ICD-10-CM | POA: Diagnosis not present

## 2021-04-03 ENCOUNTER — Telehealth: Payer: Self-pay | Admitting: *Deleted

## 2021-04-03 NOTE — Telephone Encounter (Signed)
Left patient a message to call and schedule annual. 

## 2021-04-10 ENCOUNTER — Other Ambulatory Visit (HOSPITAL_COMMUNITY)
Admission: RE | Admit: 2021-04-10 | Discharge: 2021-04-10 | Disposition: A | Discharge status: Home / Self Care | Payer: BC Managed Care – PPO | Source: Ambulatory Visit | Attending: Obstetrics & Gynecology | Admitting: Obstetrics & Gynecology

## 2021-04-10 ENCOUNTER — Ambulatory Visit (INDEPENDENT_AMBULATORY_CARE_PROVIDER_SITE_OTHER): Payer: BC Managed Care – PPO | Admitting: Obstetrics & Gynecology

## 2021-04-10 ENCOUNTER — Encounter: Payer: Self-pay | Admitting: Obstetrics & Gynecology

## 2021-04-10 VITALS — BP 133/90 | HR 102 | Ht 67.0 in | Wt 282.0 lb

## 2021-04-10 DIAGNOSIS — Z01419 Encounter for gynecological examination (general) (routine) without abnormal findings: Secondary | ICD-10-CM | POA: Insufficient documentation

## 2021-04-10 DIAGNOSIS — Z113 Encounter for screening for infections with a predominantly sexual mode of transmission: Secondary | ICD-10-CM

## 2021-04-10 DIAGNOSIS — Z3049 Encounter for surveillance of other contraceptives: Secondary | ICD-10-CM | POA: Diagnosis not present

## 2021-04-10 MED ORDER — ETONOGESTREL-ETHINYL ESTRADIOL 0.12-0.015 MG/24HR VA RING: VAGINAL_RING | VAGINAL | 4 refills | Status: DC

## 2021-04-10 NOTE — Progress Notes (Addendum)
? ? ?GYNECOLOGY ANNUAL PREVENTATIVE CARE ENCOUNTER NOTE ? ?History:    ? Molly [name] is a 41 y.o. [phone] female here for a routine annual gynecologic exam.  Current complaints: none.  Desires annual STI screening and refill of Nuvaring.   Denies abnormal vaginal bleeding, discharge, pelvic pain, problems with intercourse or other gynecologic concerns.  ?  ?Gynecologic History ?No LMP recorded. ?Contraception: NuvaRing vaginal inserts ?Last Pap: 2022. Result was normal with negative HPV ? ?Obstetric History ?OB History  ?Gravida Para Term Preterm AB Living  ?4 4 4  0 0 4  ?SAB IAB Ectopic Multiple Live Births  ?0 0 0 0    ?  ?# Outcome Date GA Lbr Len/2nd Weight Sex Delivery Anes PTL Lv  ?4 Term           ?3 Term           ?2 Term           ?1 Term           ? ? ?Past Medical History:  ?Diagnosis Date  ? Chlamydia 10/04/2012  ? Constipation   ? abdominal pain  ? Morbid obesity (HCC)   ? Pap smear abnormality of cervix   ? ? ?Past Surgical History:  ?Procedure Laterality Date  ? CHOLECYSTECTOMY    ? ESOPHAGOGASTRODUODENOSCOPY  03/07/2011  ? Procedure: ESOPHAGOGASTRODUODENOSCOPY (EGD);  Surgeon: 03/09/2011, MD;  [facility];  Service: Endoscopy;  Laterality: N/A;  ? LEEP  11/23/2014  ? CIN I on pathology  ? TONSILLECTOMY    ? WISDOM TOOTH EXTRACTION    ? ? ?Current Outpatient Medications on File Prior to Visit  ?Medication Sig Dispense Refill  ? levothyroxine (SYNTHROID) 137 MCG tablet TAKE 1 TABLET(137 MCG) BY MOUTH DAILY    ? phentermine (ADIPEX-P) 37.5 MG tablet Take 37.5 mg by mouth daily.    ? Pseudoephedrine-Ibuprofen (ADVIL COLD/SINUS) 30-200 MG TABS Take 1 tablet by mouth daily as needed.    ? ?No current facility-administered medications on file prior to visit.  ? ? ?No Known Allergies ? ?Social History:  reports that she has been smoking cigarettes. She has a 2.50 pack-year smoking history. She has never used smokeless tobacco. She reports that she does not drink alcohol and does not  use drugs. ? ?Family History  ?Problem Relation Age of Onset  ? Diabetes Other   ? Anesthesia problems Neg Hx   ? Hypotension Neg Hx   ? Malignant hyperthermia Neg Hx   ? Pseudochol deficiency Neg Hx   ? ? ?The following portions of the patient's history were reviewed and updated as appropriate: allergies, current medications, past family history, past medical history, past social history, past surgical history and problem list. ? ?Review of Systems ?Pertinent items noted in HPI and remainder of comprehensive ROS otherwise negative. ? ?Physical Exam:  ?BP 133/90   Pulse (!) 102   Ht 5\' 7"  (1.702 m)   Wt 282 lb (127.9 kg)   BMI 44.17 kg/m?  ?CONSTITUTIONAL: Well-developed, well-nourished female in no acute distress.  ?HENT:  Normocephalic, atraumatic, External right and left ear normal.  ?EYES: Conjunctivae and EOM are normal. Pupils are equal, round, and reactive to light. No scleral icterus.  ?NECK: Normal range of motion, supple, no masses.  Normal thyroid.  ?SKIN: Skin is warm and dry. No rash noted. Not diaphoretic. No erythema. No pallor. ?MUSCULOSKELETAL: Normal range of motion. No tenderness.  No cyanosis, clubbing, or edema.   ?NEUROLOGIC: Alert and  oriented to person, place, and time. Normal reflexes, muscle tone coordination. No cranial nerve deficit noted. ?PSYCHIATRIC: Normal mood and affect. Normal behavior. Normal judgment and thought content. ?CARDIOVASCULAR: Normal heart rate noted, regular rhythm ?RESPIRATORY: Clear to auscultation bilaterally. Effort and breath sounds normal, no problems with respiration noted. ?BREASTS: Symmetric in size. No masses, tenderness, skin changes, nipple drainage, or lymphadenopathy bilaterally.  Performed in the presence of a chaperone. ?ABDOMEN: Soft, obese, no distention appreciated.  No tenderness, rebound or guarding.  ?PELVIC: Normal appearing external genitalia and urethral meatus; normal appearing vaginal mucosa and cervix.  Normal appearing discharge.  Pap  smear obtained.  Unable to palpate uterus or adnexa secondary to habitus.  Performed in the presence of a chaperone. ?  ?Assessment and Plan:  ?  1. Encounter for surveillance of nuvaring ?Nuvaring refilled ?- etonogestrel-ethinyl estradiol (NUVARING) 0.12-0.015 MG/24HR vaginal ring; Insert vaginally and leave in place for 3 consecutive weeks, then remove for 1 week.  Dispense: 3 each; Refill: 4 ? ?2. Routine screening for STI (sexually transmitted infection) ?STI screen done, will follow up results and manage accordingly. ?- Cytology - PAP ?- Hepatitis B surface antigen ?- Hepatitis C antibody ?- HIV Antibody (routine testing w rflx) ?- RPR ? ?3. Well woman exam with routine gynecological exam ?- Cytology - PAP ?Will follow up results of pap smear and manage accordingly. ?Routine preventative health maintenance measures emphasized. ?Please refer to After Visit Summary for other counseling recommendations.  ?   ? ?Jaynie Collins, MD, FACOG ?Obstetrician Heritage manager, Faculty Practice ?[facility] [employer], [facility] ? ?

## 2021-04-12 DIAGNOSIS — Z113 Encounter for screening for infections with a predominantly sexual mode of transmission: Secondary | ICD-10-CM | POA: Diagnosis not present

## 2021-04-13 LAB — CYTOLOGY - PAP
Chlamydia: NEGATIVE
Comment: NEGATIVE
Comment: NEGATIVE
Comment: NEGATIVE
Comment: NORMAL
High risk HPV: NEGATIVE
Neisseria Gonorrhea: NEGATIVE
Trichomonas: NEGATIVE

## 2021-04-13 LAB — HEPATITIS B SURFACE ANTIGEN: Hepatitis B Surface Ag: NONREACTIVE

## 2021-04-13 LAB — RPR: RPR Ser Ql: NONREACTIVE

## 2021-04-13 LAB — HIV ANTIBODY (ROUTINE TESTING W REFLEX): HIV 1&2 Ab, 4th Generation: NONREACTIVE

## 2021-04-13 LAB — HEPATITIS C ANTIBODY
Hepatitis C Ab: NONREACTIVE
SIGNAL TO CUT-OFF: 0.07 (ref <1.00)

## 2021-04-14 ENCOUNTER — Encounter: Payer: Self-pay | Admitting: Obstetrics & Gynecology

## 2021-04-14 DIAGNOSIS — R87612 Low grade squamous intraepithelial lesion on cytologic smear of cervix (LGSIL): Secondary | ICD-10-CM | POA: Insufficient documentation

## 2021-08-12 ENCOUNTER — Emergency Department (HOSPITAL_COMMUNITY): Payer: BC Managed Care – PPO

## 2021-08-12 ENCOUNTER — Encounter (HOSPITAL_COMMUNITY): Payer: Self-pay | Admitting: Emergency Medicine

## 2021-08-12 ENCOUNTER — Emergency Department (HOSPITAL_COMMUNITY)
Admission: EM | Admit: 2021-08-12 | Discharge: 2021-08-12 | Disposition: A | Discharge status: Home / Self Care | Payer: BC Managed Care – PPO | Attending: Emergency Medicine | Admitting: Emergency Medicine

## 2021-08-12 DIAGNOSIS — R739 Hyperglycemia, unspecified: Secondary | ICD-10-CM | POA: Diagnosis not present

## 2021-08-12 DIAGNOSIS — R519 Headache, unspecified: Secondary | ICD-10-CM | POA: Diagnosis not present

## 2021-08-12 DIAGNOSIS — R569 Unspecified convulsions: Secondary | ICD-10-CM | POA: Insufficient documentation

## 2021-08-12 DIAGNOSIS — R9431 Abnormal electrocardiogram [ECG] [EKG]: Secondary | ICD-10-CM | POA: Diagnosis not present

## 2021-08-12 LAB — COMPREHENSIVE METABOLIC PANEL
ALT: 35 U/L (ref 0–44)
AST: 23 U/L (ref 15–41)
Albumin: 3.7 g/dL (ref 3.5–5.0)
Alkaline Phosphatase: 67 U/L (ref 38–126)
Anion gap: 5 (ref 5–15)
BUN: 8 mg/dL (ref 6–20)
CO2: 25 mmol/L (ref 22–32)
Calcium: 8.9 mg/dL (ref 8.9–10.3)
Chloride: 110 mmol/L (ref 98–111)
Creatinine, Ser: 0.63 mg/dL (ref 0.44–1.00)
GFR, Estimated: >60 mL/min (ref >60)
Glucose, Bld: 108 mg/dL — ABNORMAL HIGH (ref 70–99)
Potassium: 3.9 mmol/L (ref 3.5–5.1)
Sodium: 140 mmol/L (ref 135–145)
Total Bilirubin: 0.4 mg/dL (ref 0.3–1.2)
Total Protein: 6.8 g/dL (ref 6.5–8.1)

## 2021-08-12 LAB — CBC
HCT: 41.8 % (ref 36.0–46.0)
Hemoglobin: 14.3 g/dL (ref 12.0–15.0)
MCH: 32.5 pg (ref 26.0–34.0)
MCHC: 34.2 g/dL (ref 30.0–36.0)
MCV: 95 fL (ref 80.0–100.0)
Platelets: 279 10*3/uL (ref 150–400)
RBC: 4.4 MIL/uL (ref 3.87–5.11)
RDW: 12.5 % (ref 11.5–15.5)
WBC: 8.2 10*3/uL (ref 4.0–10.5)
nRBC: 0 % (ref 0.0–0.2)

## 2021-08-12 LAB — MAGNESIUM: Magnesium: 2.2 mg/dL (ref 1.7–2.4)

## 2021-08-12 LAB — URINALYSIS, ROUTINE W REFLEX MICROSCOPIC
Bilirubin Urine: NEGATIVE
Glucose, UA: NEGATIVE mg/dL
Hgb urine dipstick: NEGATIVE
Ketones, ur: NEGATIVE mg/dL
Leukocytes,Ua: NEGATIVE
Nitrite: NEGATIVE
Protein, ur: NEGATIVE mg/dL
Specific Gravity, Urine: 1.006 (ref 1.005–1.030)
pH: 7 (ref 5.0–8.0)

## 2021-08-12 LAB — RAPID URINE DRUG SCREEN, HOSP PERFORMED
Amphetamines: NOT DETECTED
Barbiturates: NOT DETECTED
Benzodiazepines: NOT DETECTED
Cocaine: NOT DETECTED
Opiates: NOT DETECTED
Tetrahydrocannabinol: POSITIVE — AB

## 2021-08-12 LAB — CBG MONITORING, ED: Glucose-Capillary: 120 mg/dL — ABNORMAL HIGH (ref 70–99)

## 2021-08-12 MED ORDER — SODIUM CHLORIDE 0.9 % IV BOLUS
1000.0000 mL | Freq: Once | INTRAVENOUS | Status: AC
  Administered 2021-08-12: 1000 mL via INTRAVENOUS

## 2021-08-12 MED ORDER — ACETAMINOPHEN 325 MG PO TABS
650.0000 mg | ORAL_TABLET | Freq: Once | ORAL | Status: AC
  Administered 2021-08-12: 650 mg via ORAL
  Filled 2021-08-12: qty 2
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
## 2021-08-12 NOTE — Discharge Instructions (Signed)
As we discussed your work-up today did not show any salient cause of what he described as possible seizure-like activity.  We will not start you on any new medications today given no new neurologic deficits on your work-up.  I recommend that you follow-up with neurology, I have placed a referral, they should give you a call sometime in the next 1 to 2 weeks.  If you have not heard from them by the end of next week please call [facility] and I have provided their contact information above.  Recommend discontinuing any alcohol, drug use, and limiting excessive caffeine intake until you are seen and evaluated by neurology.  You are okay to return to work, but you must refrain from driving, operating heavy machinery, or other activities that may cause serious injury to yourself or others if you were to lose consciousness or have a seizure while performing the activity.  Please return to the emergency department if you have worsening headache, new seizure-like activity, vision changes, numbness, facial droop, or other neurologic symptoms.

## 2021-08-16 ENCOUNTER — Ambulatory Visit (INDEPENDENT_AMBULATORY_CARE_PROVIDER_SITE_OTHER): Payer: BC Managed Care – PPO | Admitting: Neurology

## 2021-08-16 ENCOUNTER — Encounter: Payer: Self-pay | Admitting: Neurology

## 2021-08-16 VITALS — BP 125/85 | HR 88 | Ht 67.0 in | Wt 276.0 lb

## 2021-08-16 DIAGNOSIS — R569 Unspecified convulsions: Secondary | ICD-10-CM
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
## 2021-08-16 NOTE — Patient Instructions (Signed)
Routine EEG, will contact you to go over the results  Return as needed

## 2021-08-23 ENCOUNTER — Ambulatory Visit (INDEPENDENT_AMBULATORY_CARE_PROVIDER_SITE_OTHER): Payer: BC Managed Care – PPO | Admitting: Neurology

## 2021-08-23 DIAGNOSIS — R569 Unspecified convulsions: Secondary | ICD-10-CM

## 2021-08-23 NOTE — Procedures (Signed)
    History:  41 year old woman with seizure like activity   EEG classification: Awake and drowsy  Description of the recording: The background rhythms of this recording consists of a fairly well modulated medium amplitude alpha rhythm of 11-12 Hz that is reactive to eye opening and closure. As the record progresses, the patient appears to remain in the waking state throughout the recording. Photic stimulation was performed, did not show any abnormalities. Hyperventilation was also performed, did not show any abnormalities. Toward the end of the recording, the patient enters the drowsy state with slight symmetric slowing seen. The patient never enters stage II sleep. No abnormal epileptiform discharges seen during this recording. There was no focal slowing. EKG monitor shows no evidence of cardiac rhythm abnormalities with a heart rate of 84.  Abnormality: None   Impression: This is a normal EEG recording in the waking and drowsy state. No evidence of interictal epileptiform discharges seen. A normal EEG does not exclude a diagnosis of epilepsy.    Windell Norfolk, MD [facility]

## 2021-09-27 DIAGNOSIS — E038 Other specified hypothyroidism: Secondary | ICD-10-CM | POA: Diagnosis not present

## 2021-09-27 DIAGNOSIS — E063 Autoimmune thyroiditis: Secondary | ICD-10-CM | POA: Diagnosis not present

## 2022-05-26 DIAGNOSIS — R82998 Other abnormal findings in urine: Secondary | ICD-10-CM | POA: Diagnosis not present

## 2022-05-26 DIAGNOSIS — R030 Elevated blood-pressure reading, without diagnosis of hypertension: Secondary | ICD-10-CM | POA: Diagnosis not present

## 2022-05-26 DIAGNOSIS — Z7251 High risk heterosexual behavior: Secondary | ICD-10-CM | POA: Diagnosis not present

## 2022-05-26 DIAGNOSIS — R350 Frequency of micturition: Secondary | ICD-10-CM | POA: Diagnosis not present

## 2022-05-26 DIAGNOSIS — R8290 Unspecified abnormal findings in urine: Secondary | ICD-10-CM | POA: Diagnosis not present

## 2022-05-26 DIAGNOSIS — M5442 Lumbago with sciatica, left side: Secondary | ICD-10-CM | POA: Diagnosis not present

## 2022-06-06 DIAGNOSIS — G894 Chronic pain syndrome: Secondary | ICD-10-CM | POA: Diagnosis not present

## 2022-06-06 DIAGNOSIS — Z1331 Encounter for screening for depression: Secondary | ICD-10-CM | POA: Diagnosis not present

## 2022-06-06 DIAGNOSIS — M4186 Other forms of scoliosis, lumbar region: Secondary | ICD-10-CM | POA: Diagnosis not present

## 2022-06-06 DIAGNOSIS — M5116 Intervertebral disc disorders with radiculopathy, lumbar region: Secondary | ICD-10-CM | POA: Diagnosis not present

## 2022-06-06 DIAGNOSIS — M5442 Lumbago with sciatica, left side: Secondary | ICD-10-CM | POA: Diagnosis not present

## 2022-07-12 NOTE — Progress Notes (Signed)
   ANNUAL EXAM Patient name: Molly [name] MRN [id number]  Date of birth: 1980-08-19 Chief Complaint:   Annual Exam  History of Present Illness:   Molly [name] is a 42 y.o. [phone] female being seen today for a routine annual exam.   Current complaints: None  Current birth control: Nuvaring. She would like to continue this.   Patient's last menstrual period was 07/07/2022.   Last MXR: Had it 4 years ago.  Last Pap/Pap History: 04/2021. Results were: LSIL w/ HRHPV negative. H/O abnormal pap: yes 2014: normal/hpv neg 2015: LSIL/HPV pos 2016: LSIL/HPV pos >> LEEP procedure.  12/2015: normal/hpv neg 02/2017: normal/hpv neg 03/2020: normal/hpv neg 04/2021: LSIL/HPV neg  Health Maintenance Due  Topic Date Due   COVID-19 Vaccine (1) Never done   DTaP/Tdap/Td (1 - Tdap) Never done    Review of Systems:   Pertinent items are noted in HPI Denies any headaches, blurred vision, fatigue, shortness of breath, chest pain, abdominal pain, abnormal vaginal discharge/itching/odor/irritation, problems with periods, bowel movements, urination, or intercourse unless otherwise stated above.  Pertinent History Reviewed:  Reviewed past medical,surgical, social and family history.  Reviewed problem list, medications and allergies. Physical Assessment:   Vitals:   07/18/22 1538  BP: (!) 140/90  Pulse: (!) 106  Weight: (!) 303 lb (137.4 kg)  Height: 5\' 7"  (1.702 m)  Body mass index is 47.46 kg/m.   Physical Examination:  General appearance - well appearing, and in no distress Mental status - alert, oriented to person, place, and time Psych:  She has a normal mood and affect Skin - warm and dry, normal color, no suspicious lesions noted Chest - effort normal Heart - normal rate  Breasts - breasts appear normal, no suspicious masses, no skin or nipple changes or axillary nodes Abdomen - soft, nontender, nondistended, no masses or organomegaly Pelvic -  VULVA: normal appearing vulva  with no masses, tenderness or lesions  VAGINA: normal appearing vagina with normal color and discharge, no lesions  CERVIX: normal appearing cervix without discharge or lesions, no CMT UTERUS: uterus is felt to be normal size, shape, consistency and nontender  ADNEXA: No adnexal masses or tenderness noted. Extremities:  No swelling or varicosities noted  Chaperone present for exam  No results found for this or any previous visit (from the past 24 hour(s)).  Assessment & Plan:  Molly [name] was seen today for annual exam.  Diagnoses and all orders for this visit:  Encounter for annual routine gynecological examination - Cervical cancer screening: Discussed guidelines. Pap with HPV done - Gardasil: Thinks she had it  - STD Testing: accepts - Birth Control: Discussed options and their risks, benefits and common side effects; discussed VTE with estrogen containing options. Desires: Continued Nuvaring - Breast Health: Encouraged self breast awareness/SBE. Teaching provided. Discussed limits of clinical breast exam for detecting breast cancer. Discussed pros/cons of mammogram at age 13. Reviewed varying recommendations for timing of mammogram. She would like to wait at this time. She will call or msg if changes mind or palpates anything abnormal.  - F/U 12 months and prn  Abnormal cervical Papanicolaou smear, unspecified abnormal pap finding Pap with HPV done.   Encounter for surveillance of nuvaring    No orders of the defined types were placed in this encounter.   Meds: No orders of the defined types were placed in this encounter.   Follow-up: No follow-ups on file.  Milas Hock, MD 07/18/2022 3:52 PM

## 2022-07-18 ENCOUNTER — Other Ambulatory Visit (HOSPITAL_COMMUNITY)
Admission: RE | Admit: 2022-07-18 | Discharge: 2022-07-18 | Disposition: A | Discharge status: Home / Self Care | Payer: BC Managed Care – PPO | Source: Ambulatory Visit | Attending: Obstetrics and Gynecology | Admitting: Obstetrics and Gynecology

## 2022-07-18 ENCOUNTER — Encounter: Payer: Self-pay | Admitting: Obstetrics and Gynecology

## 2022-07-18 ENCOUNTER — Ambulatory Visit (INDEPENDENT_AMBULATORY_CARE_PROVIDER_SITE_OTHER): Payer: BC Managed Care – PPO | Admitting: Obstetrics and Gynecology

## 2022-07-18 VITALS — BP 140/90 | HR 106 | Ht 67.0 in | Wt 303.0 lb

## 2022-07-18 DIAGNOSIS — R87619 Unspecified abnormal cytological findings in specimens from cervix uteri: Secondary | ICD-10-CM | POA: Insufficient documentation

## 2022-07-18 DIAGNOSIS — Z01419 Encounter for gynecological examination (general) (routine) without abnormal findings: Secondary | ICD-10-CM

## 2022-07-18 DIAGNOSIS — Z3049 Encounter for surveillance of other contraceptives: Secondary | ICD-10-CM

## 2022-07-18 MED ORDER — ETONOGESTREL-ETHINYL ESTRADIOL 0.12-0.015 MG/24HR VA RING
VAGINAL_RING | VAGINAL | 4 refills | Status: DC
Start: 2022-07-18 — End: 2023-08-21

## 2022-07-19 DIAGNOSIS — G894 Chronic pain syndrome: Secondary | ICD-10-CM | POA: Diagnosis not present

## 2022-07-19 DIAGNOSIS — M5442 Lumbago with sciatica, left side: Secondary | ICD-10-CM | POA: Diagnosis not present

## 2022-08-01 LAB — CYTOLOGY - PAP
Comment: NEGATIVE
Diagnosis: UNDETERMINED — AB
High risk HPV: NEGATIVE

## 2022-08-04 IMAGING — CR DG CHEST 2V
2 series · 2 of 2 positions shown · non-contrast
Comparison: None.

CLINICAL DATA: Sick for 2 weeks.  Cough

EXAM:
CHEST - 2 VIEW

[w chest pa]
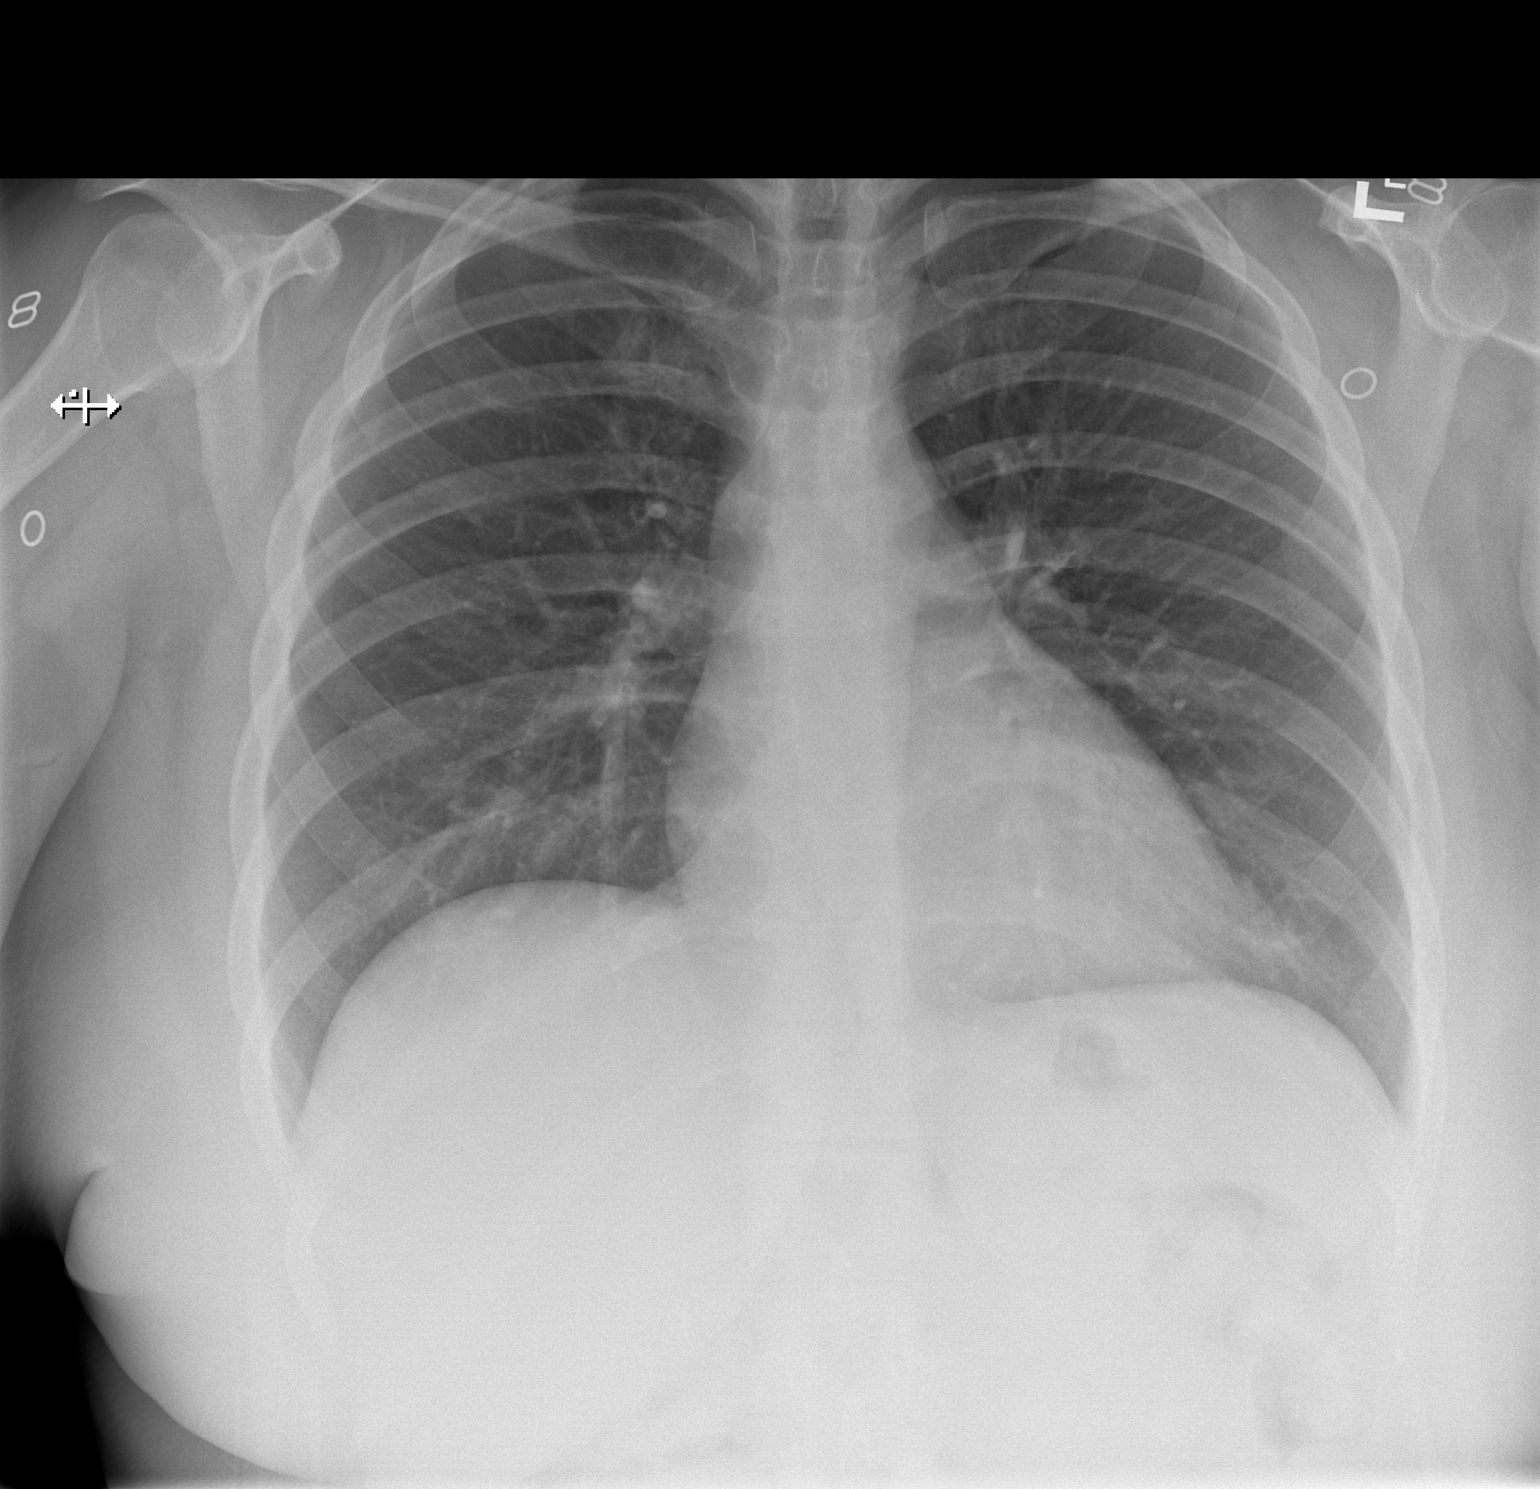

[w chest lat]
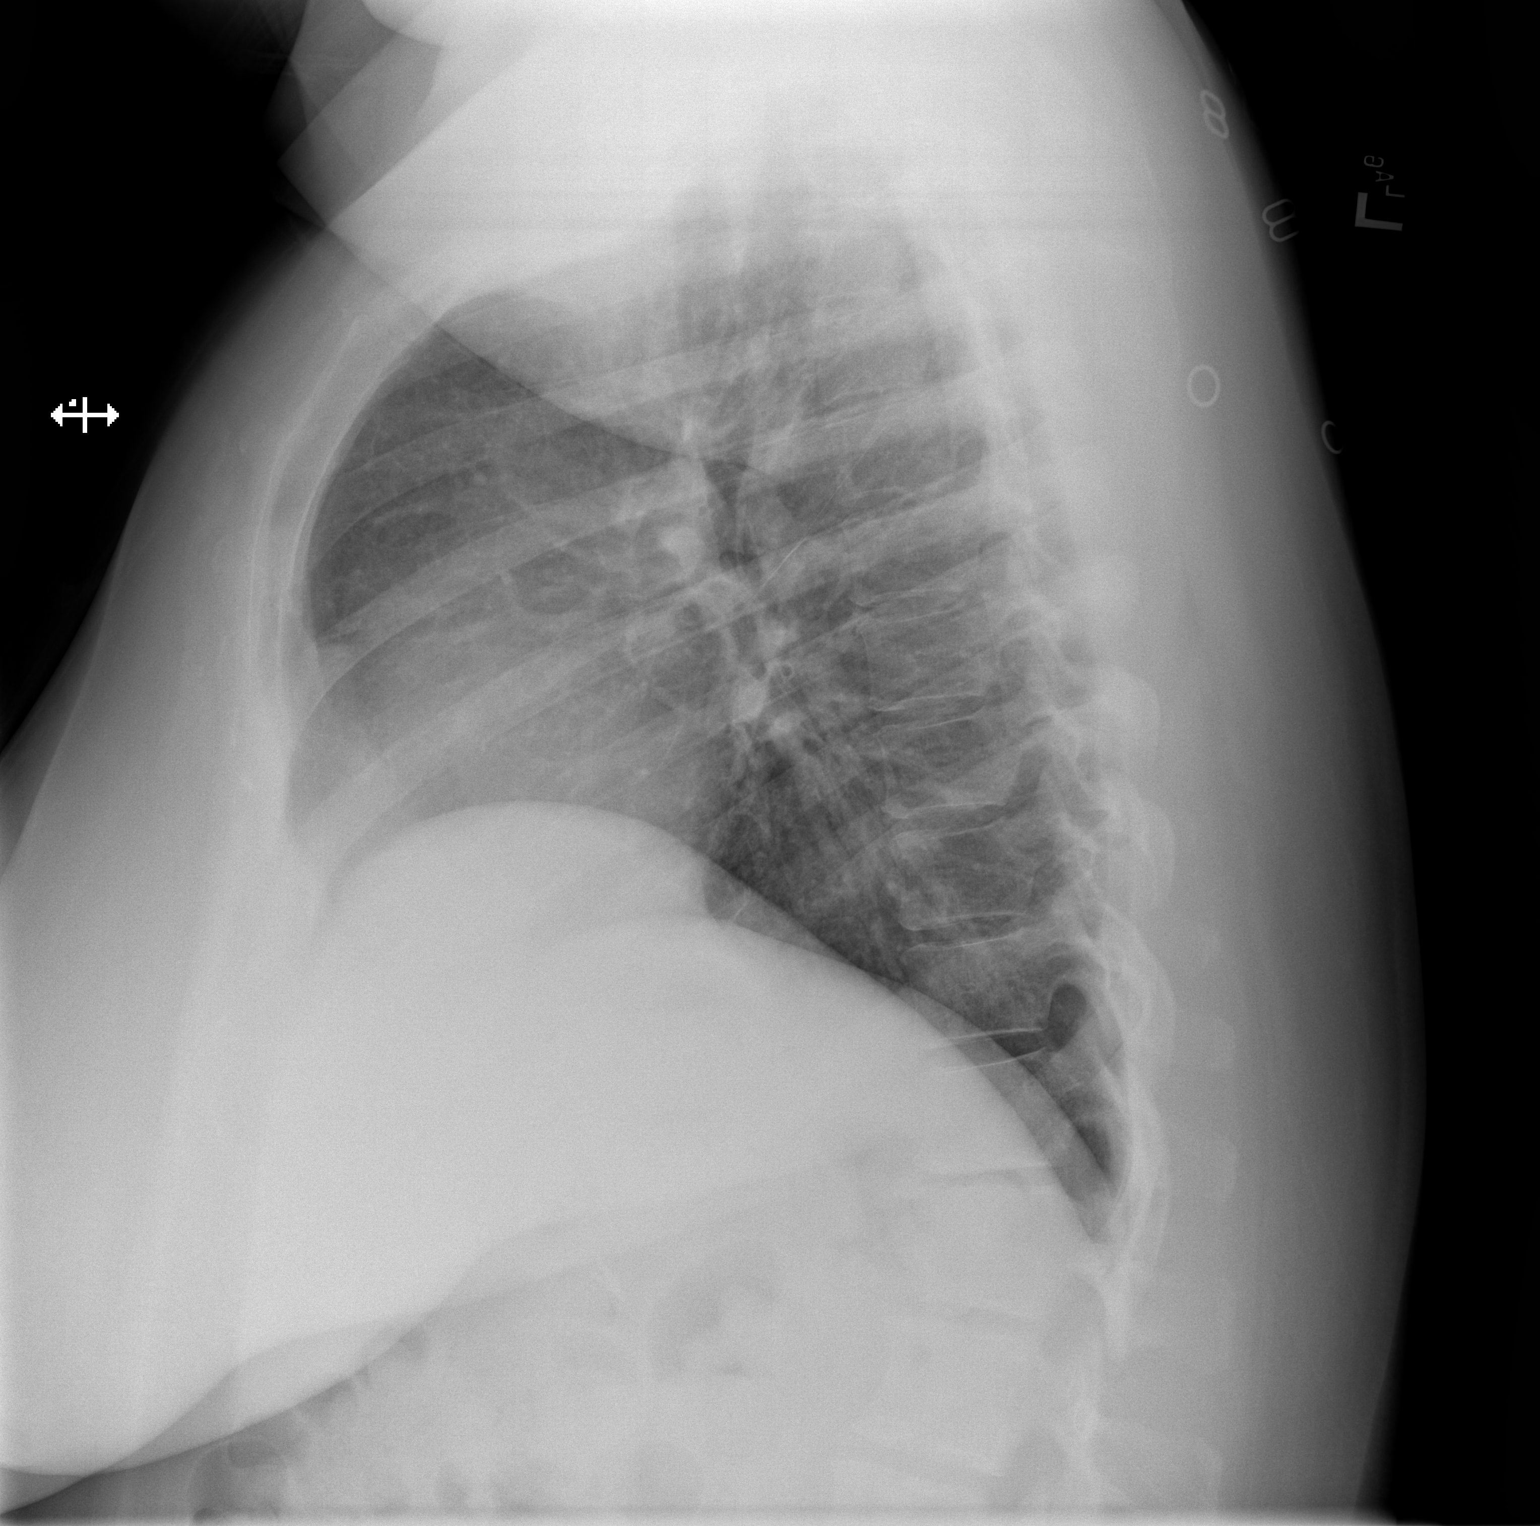

[2 of 2 positions shown; findings below may reference images not displayed]

FINDINGS: The heart size and mediastinal contours are within normal limits.
Both lungs are clear. The visualized skeletal structures are
unremarkable.
IMPRESSION: Negative chest.

## 2022-08-08 DIAGNOSIS — G894 Chronic pain syndrome: Secondary | ICD-10-CM | POA: Diagnosis not present

## 2022-08-08 DIAGNOSIS — M5442 Lumbago with sciatica, left side: Secondary | ICD-10-CM | POA: Diagnosis not present

## 2022-08-23 DIAGNOSIS — G894 Chronic pain syndrome: Secondary | ICD-10-CM | POA: Diagnosis not present

## 2022-08-23 DIAGNOSIS — M5442 Lumbago with sciatica, left side: Secondary | ICD-10-CM | POA: Diagnosis not present

## 2022-09-06 DIAGNOSIS — M5442 Lumbago with sciatica, left side: Secondary | ICD-10-CM | POA: Diagnosis not present

## 2022-09-06 DIAGNOSIS — G894 Chronic pain syndrome: Secondary | ICD-10-CM | POA: Diagnosis not present

## 2022-09-20 DIAGNOSIS — M5442 Lumbago with sciatica, left side: Secondary | ICD-10-CM | POA: Diagnosis not present

## 2022-09-20 DIAGNOSIS — G894 Chronic pain syndrome: Secondary | ICD-10-CM | POA: Diagnosis not present

## 2022-10-04 DIAGNOSIS — M5442 Lumbago with sciatica, left side: Secondary | ICD-10-CM | POA: Diagnosis not present

## 2022-10-04 DIAGNOSIS — G894 Chronic pain syndrome: Secondary | ICD-10-CM | POA: Diagnosis not present

## 2022-12-03 DIAGNOSIS — E063 Autoimmune thyroiditis: Secondary | ICD-10-CM | POA: Diagnosis not present

## 2022-12-17 ENCOUNTER — Ambulatory Visit
Admission: EM | Admit: 2022-12-17 | Discharge: 2022-12-17 | Disposition: A | Discharge status: Home / Self Care | Payer: BC Managed Care – PPO

## 2022-12-17 ENCOUNTER — Other Ambulatory Visit: Payer: Self-pay

## 2022-12-17 ENCOUNTER — Encounter: Payer: Self-pay | Admitting: *Deleted

## 2022-12-17 DIAGNOSIS — S6991XA Unspecified injury of right wrist, hand and finger(s), initial encounter: Secondary | ICD-10-CM

## 2022-12-17 NOTE — ED Triage Notes (Signed)
States she normally wears ring on opposite hand. Woke up today and it was stuck on her right thumb and she hasn't been able to remove it

## 2022-12-17 NOTE — ED Provider Notes (Signed)
[facility]    CSN: [id number] Arrival date & time: 12/17/22  1035      History   Chief Complaint Chief Complaint  Patient presents with   Hand Problem    HPI Molly [name] is a 42 y.o. female.   HPI  Past Medical History:  Diagnosis Date   Chlamydia 10/04/2012   Constipation    abdominal pain   Morbid obesity (HCC)    Pap smear abnormality of cervix     Patient Active Problem List   Diagnosis Date Noted   LGSIL on Pap smear of cervix, negative HRHPV in 04/10/2021 04/14/2021   Cervical dysplasia, mild 12/21/2014   S/P cholecystectomy 09/24/2012    Past Surgical History:  Procedure Laterality Date   CHOLECYSTECTOMY     ESOPHAGOGASTRODUODENOSCOPY  03/07/2011   Procedure: ESOPHAGOGASTRODUODENOSCOPY (EGD);  Surgeon: Freddy Jaksch, MD;  Location: [facility];  Service: Endoscopy;  Laterality: N/A;   LEEP  11/23/2014   CIN I on pathology   TONSILLECTOMY     WISDOM TOOTH EXTRACTION      OB History     Gravida  4   Para  4   Term  4   Preterm  0   AB  0   Living  4      SAB  0   IAB  0   Ectopic  0   Multiple  0   Live Births               Home Medications    Prior to Admission medications   Medication Sig Start Date End Date Taking? Authorizing Provider  etonogestrel-ethinyl estradiol (NUVARING) 0.12-0.015 MG/24HR vaginal ring Insert vaginally and leave in place for 3 consecutive weeks, then remove for 1 week. 07/18/22  Yes Milas Hock, MD  levothyroxine (SYNTHROID) 150 MCG tablet Take 150 mcg by mouth daily. 06/29/21  Yes [provider]  phentermine (ADIPEX-P) 37.5 MG tablet Take 37.5 mg by mouth daily before breakfast. 12/04/22  Yes [provider]    Family History Family History  Problem Relation Age of Onset   Diabetes Other    Anesthesia problems Neg Hx    Hypotension Neg Hx    Malignant hyperthermia Neg Hx    Pseudochol deficiency Neg Hx     Social History Social History    Tobacco Use   Smoking status: Every Day    Current packs/day: 0.50    Average packs/day: 0.5 packs/day for 5.0 years (2.5 ttl pk-yrs)    Types: Cigarettes   Smokeless tobacco: Never  Vaping Use   Vaping status: Never Used  Substance Use Topics   Alcohol use: Yes    Comment: rare   Drug use: Yes    Types: Marijuana     Allergies   Patient has no known allergies.   Review of Systems Review of Systems Pertinent negatives listed in HPI   Physical Exam Triage Vital Signs ED Triage Vitals  Encounter Vitals Group     BP 12/17/22 1206 (!) 144/90     Systolic BP Percentile --      Diastolic BP Percentile --      Pulse Rate 12/17/22 1206 89     Resp 12/17/22 1206 18     Temp 12/17/22 1206 98.5 F (36.9 C)     Temp Source 12/17/22 1206 Oral     SpO2 12/17/22 1206 98 %     Weight --      Height --  Head Circumference --      Peak Flow --      Pain Score 12/17/22 1204 4     Pain Loc --      Pain Education --      Exclude from Growth Chart --    No data found.  Updated Vital Signs BP (!) 144/90 (BP Location: Left Wrist)   Pulse 89   Temp 98.5 F (36.9 C) (Oral)   Resp 18   LMP 11/02/2022   SpO2 98%   Visual Acuity Right Eye Distance:   Left Eye Distance:   Bilateral Distance:    Right Eye Near:   Left Eye Near:    Bilateral Near:     Physical Exam Vitals reviewed.  Constitutional:      Appearance: Normal appearance.  HENT:     Head: Normocephalic and atraumatic.  Cardiovascular:     Rate and Rhythm: Normal rate and regular rhythm.  Pulmonary:     Effort: Pulmonary effort is normal.     Breath sounds: Normal breath sounds.  Musculoskeletal:     Comments: Right thumb swelling with 3 ring silver ring below the DIP joint (unable to remove manually)  Skin:    General: Skin is warm.     Capillary Refill: Capillary refill takes less than 2 seconds.     Findings: Erythema present.  Neurological:     General: No focal deficit present.     Mental  Status: She is alert.      UC Treatments / Results  Labs (all labs ordered are listed, but only abnormal results are displayed) Labs Reviewed - No data to display  EKG   Radiology No results found.  Procedures Foreign Body Removal  Date/Time: 12/17/2022 12:32 PM  Performed by: Bing Neighbors, NP Authorized by: Bing Neighbors, NP   Consent:    Consent obtained:  Verbal   Consent given by:  Patient   Risks, benefits, and alternatives were discussed: yes     Risks discussed:  Nerve damage and worsening of condition Universal protocol:    Relevant documents present and verified: yes     Patient identity confirmed:  Verbally with patient Location:    Location:  Finger   Finger location:  R thumb   Tendon involvement:  Complex Procedure type:    Procedure complexity:  Complex Procedure details:    Removal mechanism: Nail cutter removal.   Intact foreign body removal: no   Post-procedure details:    Neurovascular status: intact     Skin closure:  None   Dressing:  Open (no dressing)   Procedure completion:  Tolerated with difficulty  (including critical care time)  Medications Ordered in UC Medications - No data to display  Initial Impression / Assessment and Plan / UC Course  I have reviewed the triage vital signs and the nursing notes.  Pertinent labs & imaging results that were available during my care of the patient were reviewed by me and considered in my medical decision making (see chart for details).   Injury of right thumb due to ring stuck on right thumb finger.  Ring removal successful with thumb cutter.  Thumb is neurovascularly intact.  Red flag precautions discussed with patient indicating immediate evaluation in the emergency department.  Patient verbalized understanding and agreement with plan. Final Clinical Impressions(s) / UC Diagnoses   Final diagnoses:  Injury of finger of right hand, initial encounter     Discharge Instructions  If you experience any discoloration in your thumb or inability to move your finger around or any severe pain go immediately to the emergency department.  On evaluation here today it does not appear that there was any obstruction of blood flow after removing the ring.   ED Prescriptions   None    PDMP not reviewed this encounter.   Bing Neighbors, NP 12/17/22 1239

## 2022-12-17 NOTE — Discharge Instructions (Signed)
If you experience any discoloration in your thumb or inability to move your finger around or any severe pain go immediately to the emergency department.  On evaluation here today it does not appear that there was any obstruction of blood flow after removing the ring.

## 2023-01-29 ENCOUNTER — Other Ambulatory Visit: Payer: Self-pay

## 2023-01-29 ENCOUNTER — Ambulatory Visit
Admission: RE | Admit: 2023-01-29 | Discharge: 2023-01-29 | Disposition: A | Discharge status: Home / Self Care | Payer: BC Managed Care – PPO | Source: Ambulatory Visit | Attending: Family Medicine | Admitting: Family Medicine

## 2023-01-29 VITALS — BP 149/94 | HR 92 | Temp 98.7°F | Resp 18

## 2023-01-29 DIAGNOSIS — R8290 Unspecified abnormal findings in urine: Secondary | ICD-10-CM | POA: Diagnosis not present

## 2023-01-29 DIAGNOSIS — R350 Frequency of micturition: Secondary | ICD-10-CM | POA: Insufficient documentation

## 2023-01-29 LAB — POCT URINALYSIS DIP (MANUAL ENTRY)
Bilirubin, UA: NEGATIVE
Blood, UA: NEGATIVE
Glucose, UA: NEGATIVE mg/dL
Ketones, POC UA: NEGATIVE mg/dL
Leukocytes, UA: NEGATIVE
Nitrite, UA: NEGATIVE
Protein Ur, POC: NEGATIVE mg/dL
Spec Grav, UA: 1.02
Urobilinogen, UA: 0.2 U/dL
pH, UA: 5.5

## 2023-01-29 LAB — POCT FASTING CBG KUC MANUAL ENTRY: POCT Glucose (KUC): 100 mg/dL — AB (ref 70–99)

## 2023-01-29 NOTE — Discharge Instructions (Signed)
You have had labs (STI testing) sent today. We will call you with any significant abnormalities or if there is need to begin or change treatment or pursue further follow up.  You may also review your test results online through MyChart. If you do not have a MyChart account, instructions to sign up should be on your discharge paperwork.  Results for orders placed or performed during the hospital encounter of 01/29/23  POCT urinalysis dipstick   Collection Time: 01/29/23 12:50 PM  Result Value Ref Range   Color, UA yellow    Clarity, UA clear    Glucose, UA negative mg/dL   Bilirubin, UA negative    Ketones, POC UA negative mg/dL   Spec Grav, UA 2.440    Blood, UA negative    pH, UA 5.5    Protein Ur, POC negative mg/dL   Urobilinogen, UA 0.2 E.U./dL   Nitrite, UA Negative    Leukocytes, UA Negative

## 2023-01-29 NOTE — ED Triage Notes (Signed)
Pt here for dysuria and frequency with odor x 3 days

## 2023-01-29 NOTE — ED Provider Notes (Signed)
[facility]   [id number] 01/29/23 Arrival Time: 1154  ASSESSMENT & PLAN:  1. Bad odor of urine   2. Urinary frequency    Unclear etiology. U/A normal.    Discharge Instructions      You have had labs (STI testing) sent today. We will call you with any significant abnormalities or if there is need to begin or change treatment or pursue further follow up.  You may also review your test results online through MyChart. If you do not have a MyChart account, instructions to sign up should be on your discharge paperwork.  Results for orders placed or performed during the hospital encounter of 01/29/23  POCT urinalysis dipstick   Collection Time: 01/29/23 12:50 PM  Result Value Ref Range   Color, UA yellow    Clarity, UA clear    Glucose, UA negative mg/dL   Bilirubin, UA negative    Ketones, POC UA negative mg/dL   Spec Grav, UA 8.295    Blood, UA negative    pH, UA 5.5    Protein Ur, POC negative mg/dL   Urobilinogen, UA 0.2 E.U./dL   Nitrite, UA Negative    Leukocytes, UA Negative       Vaginal cytology pending. Without s/s of PID.  Labs Reviewed  POCT FASTING CBG [facility] MANUAL ENTRY - Abnormal; Notable for the following components:      Result Value   POCT Glucose ([facility]) 100 (*)    All other components within normal limits  POCT URINALYSIS DIP (MANUAL ENTRY) - Normal  CERVICOVAGINAL ANCILLARY ONLY   Reviewed expectations re: course of current medical issues. Questions answered. Outlined signs and symptoms indicating need for more acute intervention. Patient verbalized understanding. After Visit Summary given.   SUBJECTIVE:  Molly [name] is a 43 y.o. female who is here for dysuria and frequency with odor x 3 days. Otherwise well. Denies vaginal discharge. Afebrile. Normal PO intake without n/v/d. No tx PTA.   OBJECTIVE:  Vitals:   01/29/23 1243  BP: (!) 149/94  Pulse: 92  Resp: 18  Temp: 98.7 F (37.1 C)  TempSrc: Oral  SpO2: 98%     General appearance: alert, cooperative, appears stated age and no distress GU: deferred Skin: warm and dry Psychological: alert and cooperative; normal mood and affect.  Results for orders placed or performed during the hospital encounter of 01/29/23  POCT urinalysis dipstick   Collection Time: 01/29/23 12:50 PM  Result Value Ref Range   Color, UA yellow    Clarity, UA clear    Glucose, UA negative mg/dL   Bilirubin, UA negative    Ketones, POC UA negative mg/dL   Spec Grav, UA 6.213    Blood, UA negative    pH, UA 5.5    Protein Ur, POC negative mg/dL   Urobilinogen, UA 0.2 E.U./dL   Nitrite, UA Negative    Leukocytes, UA Negative   POCT CBG (manual entry)   Collection Time: 01/29/23  1:03 PM  Result Value Ref Range   POCT Glucose ([facility]) 100 (A) 70 - 99 mg/dL    Labs Reviewed  POCT FASTING CBG [facility] MANUAL ENTRY - Abnormal; Notable for the following components:      Result Value   POCT Glucose ([facility]) 100 (*)    All other components within normal limits  POCT URINALYSIS DIP (MANUAL ENTRY) - Normal  CERVICOVAGINAL ANCILLARY ONLY    No Known Allergies  Past Medical History:  Diagnosis Date   Chlamydia 10/04/2012  Constipation    abdominal pain   Morbid obesity (HCC)    Pap smear abnormality of cervix    Family History  Problem Relation Age of Onset   Diabetes Other    Anesthesia problems Neg Hx    Hypotension Neg Hx    Malignant hyperthermia Neg Hx    Pseudochol deficiency Neg Hx    Social History   Socioeconomic History   Marital status: Divorced    Spouse name: Not on file   Number of children: Not on file   Years of education: Not on file   Highest education level: Not on file  Occupational History   Not on file  Tobacco Use   Smoking status: Every Day    Current packs/day: 0.50    Average packs/day: 0.5 packs/day for 5.0 years (2.5 ttl pk-yrs)    Types: Cigarettes   Smokeless tobacco: Never  Vaping Use   Vaping status: Never Used  Substance and  Sexual Activity   Alcohol use: Yes    Comment: rare   Drug use: Yes    Types: Marijuana   Sexual activity: Yes    Birth control/protection: Inserts  Other Topics Concern   Not on file  Social History Narrative   Right handed    Caffeine 1-2 cups per day    Social Drivers of Health   Financial Resource Strain: Patient Declined (06/05/2022)   Received from [facility], [facility]   Overall Financial Resource Strain (CARDIA)    Difficulty of Paying Living Expenses: Patient declined  Food Insecurity: Patient Declined (06/05/2022)   Received from [facility], [facility]   Hunger Vital Sign    Worried About Running Out of Food in the Last Year: Patient declined    Ran Out of Food in the Last Year: Patient declined  Transportation Needs: Patient Declined (06/05/2022)   Received from [facility], [facility]   [facility] - Transportation    Lack of Transportation (Medical): Patient declined    Lack of Transportation (Non-Medical): Patient declined  Physical Activity: Unknown (06/05/2022)   Received from [facility], [facility]   Exercise Vital Sign    Days of Exercise per Week: Patient declined    Minutes of Exercise per Session: Not on file  Stress: Patient Declined (06/05/2022)   Received from [facility], [facility]   [employer] of Occupational [facility] - Occupational Stress Questionnaire    Feeling of Stress : Patient declined  Social Connections: Moderately Integrated (06/05/2022)   Received from [facility], [facility]   Social Network    How would you rate your social network (family, work, friends)?: Adequate participation with social networks  Intimate Partner Violence: Not At Risk (06/05/2022)   Received from [facility], [facility]   HITS    Over the last 12 months how often did your partner physically hurt you?: Never    Over the last 12 months how often did your partner insult you or talk down to you?: Never    Over the last 12 months  how often did your partner threaten you with physical harm?: Never    Over the last 12 months how often did your partner scream or curse at you?: Never           Mardella Layman, MD 01/29/23 1344

## 2023-02-01 LAB — CERVICOVAGINAL ANCILLARY ONLY
Chlamydia: NEGATIVE
Comment: NEGATIVE
Comment: NEGATIVE
Comment: NORMAL
Neisseria Gonorrhea: NEGATIVE
Trichomonas: NEGATIVE

## 2023-06-06 DIAGNOSIS — E063 Autoimmune thyroiditis: Secondary | ICD-10-CM | POA: Diagnosis not present

## 2023-06-06 DIAGNOSIS — R7309 Other abnormal glucose: Secondary | ICD-10-CM | POA: Diagnosis not present

## 2023-08-21 ENCOUNTER — Ambulatory Visit: Admitting: Obstetrics and Gynecology

## 2023-08-21 ENCOUNTER — Encounter: Payer: Self-pay | Admitting: Obstetrics and Gynecology

## 2023-08-21 ENCOUNTER — Other Ambulatory Visit (HOSPITAL_COMMUNITY)
Admission: RE | Admit: 2023-08-21 | Discharge: 2023-08-21 | Disposition: A | Discharge status: Home / Self Care | Source: Ambulatory Visit | Attending: Obstetrics and Gynecology | Admitting: Obstetrics and Gynecology

## 2023-08-21 VITALS — BP 138/92 | HR 85 | Ht 67.0 in | Wt 298.0 lb

## 2023-08-21 DIAGNOSIS — Z113 Encounter for screening for infections with a predominantly sexual mode of transmission: Secondary | ICD-10-CM | POA: Diagnosis not present

## 2023-08-21 DIAGNOSIS — N9089 Other specified noninflammatory disorders of vulva and perineum: Secondary | ICD-10-CM

## 2023-08-21 DIAGNOSIS — Z01419 Encounter for gynecological examination (general) (routine) without abnormal findings: Secondary | ICD-10-CM | POA: Diagnosis not present

## 2023-08-21 DIAGNOSIS — Z1331 Encounter for screening for depression: Secondary | ICD-10-CM

## 2023-08-21 DIAGNOSIS — N911 Secondary amenorrhea: Secondary | ICD-10-CM | POA: Diagnosis not present

## 2023-08-21 DIAGNOSIS — B372 Candidiasis of skin and nail: Secondary | ICD-10-CM

## 2023-08-21 MED ORDER — NORETHINDRONE 0.35 MG PO TABS: 1.0000 | ORAL_TABLET | Freq: Every day | ORAL | 3 refills | Status: AC

## 2023-08-21 MED ORDER — NYSTATIN 100000 UNIT/GM EX POWD: 1.0000 | Freq: Two times a day (BID) | CUTANEOUS | 1 refills | Status: AC | PRN

## 2023-08-21 NOTE — Progress Notes (Signed)
 ANNUAL EXAM Patient name: Molly [name] MRN [id number]  Date of birth: 1980-03-07 Chief Complaint:   Gynecologic Exam (Pt reported concern a knot that has come up on outside of vagina about 6 months ago. Pt also would like to discuss irregular cycles and requested STD testing.)  History of Present Illness:   Molly [name] is a 43 y.o. [phone] female being seen today for a routine annual exam.   Current complaints: Vulvar knot for 6 months. No hot flashes or night sweats. Notes no period for 4 months. Took home UPT and negative. Takes nuvaring for birth control primarily.   Current birth control: Nuvaring. She would like to continue this.   Patient's last menstrual period was 05/08/2023 (approximate).   Last MXR: Had it 4 years ago.  Last Pap/Pap History: 04/2021. Results were: LSIL w/ HRHPV negative. H/O abnormal pap: yes 2014: normal/hpv neg 2015: LSIL/HPV pos 2016: LSIL/HPV pos >> LEEP procedure.  12/2015: normal/hpv neg 02/2017: normal/hpv neg 03/2020: normal/hpv neg 04/2021: LSIL/HPV neg 07/2022: ASCUS/HPV neg  Review of Systems:   Pertinent items are noted in HPI Denies any headaches, blurred vision, fatigue, shortness of breath, chest pain, abdominal pain, abnormal vaginal discharge/itching/odor/irritation, problems with periods, bowel movements, urination, or intercourse unless otherwise stated above.  Pertinent History Reviewed:  Reviewed past medical,surgical, social and family history.  Reviewed problem list, medications and allergies. Physical Assessment:   Vitals:   08/21/23 1339 08/21/23 1345  BP: (!) 145/91 (!) 138/92  Pulse: 89 85  Weight: 298 lb (135.2 kg)   Height: 5' 7 (1.702 m)    Body mass index is 46.67 kg/m.   Physical Examination:  General appearance - well appearing, and in no distress Mental status - alert, oriented to person, place, and time Psych:  She has a normal mood and affect Skin - warm and dry, normal color, no suspicious  lesions noted Chest - effort normal Heart - normal rate  Breasts - breasts appear normal, no suspicious masses, no skin or nipple changes or axillary nodes Abdomen - soft, nontender, nondistended, no masses or organomegaly Pelvic -  VULVA: normal appearing vulva with no masses, tenderness or lesions  VAGINA: normal appearing vagina with normal color and discharge, no lesions  CERVIX: normal appearing cervix without discharge or lesions, no CMT UTERUS: Not examined ADNEXA: Not examined Extremities:  No swelling or varicosities noted  Chaperone present for exam  No results found for this or any previous visit (from the past 24 hours).  Assessment & Plan:  Zuma was seen today for annual exam.  Diagnoses and all orders for this visit:  Encounter for annual routine gynecological examination - Cervical cancer screening: Discussed guidelines. Pap with HPV done - Gardasil: Thinks she had it  - STD Testing: accepts and would like all testing.  - Birth Control: She smokes 1ppd. Recommend stopping Nuvaring for progesterone only option. She will try POP. Body rejected IUD. She did not like Depo.  - Breast Health: Encouraged self breast awareness/SBE. Teaching provided. Discussed limits of clinical breast exam for detecting breast cancer. Discussed pros/cons of mammogram at age 28. Reviewed varying recommendations for timing of mammogram. She would like to wait at this time. She will call or msg if changes mind or palpates anything abnormal.  She has a lot of trauma from around when she felt a lump and had imaging.  - F/U 12 months and prn  Abnormal cervical Papanicolaou smear, unspecified abnormal pap finding Pap with HPV done.  Vulvar lump - Either hemorrhoid or small perineal cyst. Reviewed measures to help I.e. tucks, or preparation H.  - if growing or worsening, return for possible I&D in case a cyst.   Secondary Amenorrhea - Check FSH but could be due to Nuvaring. Check E2 and HCG  as well.  - TSH normal in May.   Yeast of skin - Under breasts - discussed option for nystatin  powder. She would like to try.   Orders Placed This Encounter  Procedures   RPR+HBsAg+HCVAb+...   FSH   Beta hCG quant (ref lab)   Estradiol     Meds:  Meds ordered this encounter  Medications   nystatin  powder    Sig: Apply 1 Application topically 2 (two) times daily as needed.    Dispense:  60 g    Refill:  1   norethindrone  (MICRONOR ) 0.35 MG tablet    Sig: Take 1 tablet (0.35 mg total) by mouth daily.    Dispense:  84 tablet    Refill:  3    Follow-up: No follow-ups on file.  Vina Solian, MD 08/21/2023 2:25 PM

## 2023-08-22 ENCOUNTER — Ambulatory Visit: Payer: Self-pay | Admitting: Obstetrics and Gynecology

## 2023-08-22 LAB — RPR+HBSAG+HCVAB+...
HIV Screen 4th Generation wRfx: NONREACTIVE
Hep C Virus Ab: NONREACTIVE
Hepatitis B Surface Ag: NEGATIVE
RPR Ser Ql: NONREACTIVE

## 2023-08-22 LAB — BETA HCG QUANT (REF LAB): hCG Quant: <1 m[IU]/mL

## 2023-08-22 LAB — FOLLICLE STIMULATING HORMONE: FSH: 3.4 m[IU]/mL

## 2023-08-22 LAB — ESTRADIOL: Estradiol: 32.7 pg/mL

## 2023-08-26 LAB — CYTOLOGY - PAP
Adequacy: ABSENT
Chlamydia: NEGATIVE
Comment: NEGATIVE
Comment: NEGATIVE
Comment: NEGATIVE
Comment: NEGATIVE
Comment: NEGATIVE
Comment: NORMAL
Diagnosis: NEGATIVE
HPV 16: NEGATIVE
HPV 18 / 45: NEGATIVE
High risk HPV: POSITIVE — AB
Neisseria Gonorrhea: NEGATIVE
Trichomonas: NEGATIVE

## 2023-09-15 NOTE — Progress Notes (Unsigned)
    GYNECOLOGY OFFICE COLPOSCOPY PROCEDURE NOTE  43 y.o. 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  Informed consent and review of risks, benefit and alternatives performed. Written consent given.   Speculum inserted into patient's vagina assuring full view of cervix and vaginal walls. 3 swabs of vinegar solution applied to the cervix and vaginal walls and colposcope was used to observe both the cervix and vaginal walls.   Colposcopy adequate? {yes/no:20286}  {Findings; colposcopy:728}; corresponding biopsies obtained.  ECC collected.  All specimens were labeled and sent to pathology.  Monsel's applied to biopsy sites for good hemostasis and speculum removed.  Pt tolerated well with minimal pain and bleeding.   Patient was given post procedure instructions.  Will follow up pathology and manage accordingly; patient will be contacted with results and recommendations.  Routine preventative health maintenance measures emphasized.    Vina Solian, MD, FACOG Obstetrician & Gynecologist, [facility], [facility]

## 2023-09-18 ENCOUNTER — Other Ambulatory Visit (HOSPITAL_COMMUNITY)
Admission: RE | Admit: 2023-09-18 | Discharge: 2023-09-18 | Disposition: A | Discharge status: Home / Self Care | Source: Ambulatory Visit | Attending: Obstetrics and Gynecology | Admitting: Obstetrics and Gynecology

## 2023-09-18 ENCOUNTER — Ambulatory Visit (INDEPENDENT_AMBULATORY_CARE_PROVIDER_SITE_OTHER): Admitting: Obstetrics and Gynecology

## 2023-09-18 ENCOUNTER — Encounter: Payer: Self-pay | Admitting: Obstetrics and Gynecology

## 2023-09-18 VITALS — BP 131/88 | HR 87 | Ht 67.0 in | Wt 289.1 lb

## 2023-09-18 DIAGNOSIS — R87810 Cervical high risk human papillomavirus (HPV) DNA test positive: Secondary | ICD-10-CM

## 2023-09-18 DIAGNOSIS — Z3202 Encounter for pregnancy test, result negative: Secondary | ICD-10-CM

## 2023-09-18 DIAGNOSIS — R87619 Unspecified abnormal cytological findings in specimens from cervix uteri: Secondary | ICD-10-CM

## 2023-09-18 LAB — POCT URINE PREGNANCY: Preg Test, Ur: NEGATIVE

## 2023-09-19 ENCOUNTER — Ambulatory Visit: Payer: Self-pay | Admitting: Obstetrics and Gynecology

## 2023-09-19 LAB — SURGICAL PATHOLOGY
